# Patient Record
Sex: Female | Born: 1950
Health system: Southern US, Community
[De-identification: ages and names within clinical notes are randomized; demographics above are authoritative.]

## PROBLEM LIST (undated history)

## (undated) DIAGNOSIS — Z9889 Other specified postprocedural states: Secondary | ICD-10-CM

## (undated) DIAGNOSIS — J189 Pneumonia, unspecified organism: Secondary | ICD-10-CM

## (undated) DIAGNOSIS — R112 Nausea with vomiting, unspecified: Secondary | ICD-10-CM

## (undated) DIAGNOSIS — H269 Unspecified cataract: Secondary | ICD-10-CM

## (undated) DIAGNOSIS — J45909 Unspecified asthma, uncomplicated: Secondary | ICD-10-CM

## (undated) HISTORY — PX: BACK SURGERY: SHX140

## (undated) HISTORY — PX: TOTAL HIP ARTHROPLASTY: SHX124

## (undated) HISTORY — PX: APPENDECTOMY: SHX54

## (undated) HISTORY — PX: BREAST LUMPECTOMY: SHX2

## (undated) HISTORY — PX: TONSILLECTOMY: SUR1361

## (undated) HISTORY — DX: Unspecified cataract: H26.9

---

## 2019-07-20 ENCOUNTER — Other Ambulatory Visit: Payer: Self-pay | Admitting: Neurosurgery

## 2019-07-20 DIAGNOSIS — G959 Disease of spinal cord, unspecified: Secondary | ICD-10-CM

## 2019-07-20 DIAGNOSIS — M5416 Radiculopathy, lumbar region: Secondary | ICD-10-CM

## 2019-08-20 ENCOUNTER — Ambulatory Visit
Admission: RE | Admit: 2019-08-20 | Discharge: 2019-08-20 | Disposition: A | Discharge status: Home / Self Care | Payer: Medicare Other | Source: Ambulatory Visit | Attending: Neurosurgery | Admitting: Neurosurgery

## 2019-08-20 ENCOUNTER — Other Ambulatory Visit: Payer: Self-pay

## 2019-08-20 DIAGNOSIS — G959 Disease of spinal cord, unspecified: Secondary | ICD-10-CM

## 2019-08-20 DIAGNOSIS — M5416 Radiculopathy, lumbar region: Secondary | ICD-10-CM

## 2019-08-26 ENCOUNTER — Other Ambulatory Visit: Payer: Self-pay | Admitting: Neurosurgery

## 2019-09-17 NOTE — Pre-Procedure Instructions (Signed)
[id number] [place], [place] - [place] [address] [place] [address] Phone: [phone] Fax: [phone]      Your procedure is scheduled on Monday, October 12th.  Report to [facility] Main Entrance "A" at 6:00 A.M., and check in at the Admitting office.  Call this number if you have problems the morning of surgery:  [phone]  Call [phone] if you have any questions prior to your surgery date Monday-Friday 8am-4pm    Remember:  Do not eat or drink after midnight the night before your surgery    Take these medicines the morning of surgery with A SIP OF WATER  ondansetron (ZOFRAN)-as needed traMADol (ULTRAM)-as needed  As of today, STOP taking any Aspirin (unless otherwise instructed by your surgeon), Aleve, Naproxen, Ibuprofen, Motrin, Advil, Goody's, BC's, all herbal medications, fish oil, and all vitamins.    The Morning of Surgery  Do not wear jewelry, make-up or nail polish.  Do not wear lotions, powders, or perfumes, or deodorant  Do not shave 48 hours prior to surgery.    Do not bring valuables to the hospital.  [facility] is not responsible for any belongings or valuables.  If you are a smoker, DO NOT Smoke 24 hours prior to surgery IF you wear a CPAP at night please bring your mask, tubing, and machine the morning of surgery   Remember that you must have someone to transport you home after your surgery, and remain with you for 24 hours if you are discharged the same day.   Contacts, glasses, hearing aids, dentures or bridgework may not be worn into surgery.    Leave your suitcase in the car.  After surgery it may be brought to your room.  For patients admitted to the hospital, discharge time will be determined by your treatment team.  Patients discharged the day of surgery will not be allowed to drive home.    Special instructions:   [facility]- Preparing For Surgery  Before surgery, you  can play an important role. Because skin is not sterile, your skin needs to be as free of germs as possible. You can reduce the number of germs on your skin by washing with CHG (chlorahexidine gluconate) Soap before surgery.  CHG is an antiseptic cleaner which kills germs and bonds with the skin to continue killing germs even after washing.    Oral Hygiene is also important to reduce your risk of infection.  Remember - BRUSH YOUR TEETH THE MORNING OF SURGERY WITH YOUR REGULAR TOOTHPASTE  Please do not use if you have an allergy to CHG or antibacterial soaps. If your skin becomes reddened/irritated stop using the CHG.  Do not shave (including legs and underarms) for at least 48 hours prior to first CHG shower. It is OK to shave your face.  Please follow these instructions carefully.   1. Shower the NIGHT BEFORE SURGERY and the MORNING OF SURGERY with CHG Soap.   2. If you chose to wash your hair, wash your hair first as usual with your normal shampoo.  3. After you shampoo, rinse your hair and body thoroughly to remove the shampoo.  4. Use CHG as you would any other liquid soap. You can apply CHG directly to the skin and wash gently with a scrungie or a clean washcloth.   5. Apply the CHG Soap to your body ONLY FROM THE NECK DOWN.  Do not use on open  wounds or open sores. Avoid contact with your eyes, ears, mouth and genitals (private parts). Wash Face and genitals (private parts)  with your normal soap.   6. Wash thoroughly, paying special attention to the area where your surgery will be performed.  7. Thoroughly rinse your body with warm water from the neck down.  8. DO NOT shower/wash with your normal soap after using and rinsing off the CHG Soap.  9. Pat yourself dry with a CLEAN TOWEL.  10. Wear CLEAN PAJAMAS to bed the night before surgery, wear comfortable clothes the morning of surgery  11. Place CLEAN SHEETS on your bed the night of your first shower and DO NOT SLEEP WITH  PETS.    Day of Surgery:  Do not apply any deodorants/lotions. Please shower the morning of surgery with the CHG soap  Please wear clean clothes to the hospital/surgery center.   Remember to brush your teeth WITH YOUR REGULAR TOOTHPASTE.   Please read over the following fact sheets that you were given.

## 2019-09-20 ENCOUNTER — Other Ambulatory Visit: Payer: Self-pay

## 2019-09-20 ENCOUNTER — Encounter (HOSPITAL_COMMUNITY)
Admission: RE | Admit: 2019-09-20 | Discharge: 2019-09-20 | Disposition: A | Discharge status: Home / Self Care | Payer: Medicare Other | Source: Ambulatory Visit | Attending: Neurosurgery | Admitting: Neurosurgery

## 2019-09-20 ENCOUNTER — Encounter (HOSPITAL_COMMUNITY): Payer: Self-pay

## 2019-09-20 DIAGNOSIS — Z01812 Encounter for preprocedural laboratory examination: Secondary | ICD-10-CM | POA: Insufficient documentation

## 2019-09-20 HISTORY — DX: Pneumonia, unspecified organism: J18.9

## 2019-09-20 HISTORY — DX: Other specified postprocedural states: Z98.890

## 2019-09-20 HISTORY — DX: Other specified postprocedural states: R11.2

## 2019-09-20 HISTORY — DX: Unspecified asthma, uncomplicated: J45.909

## 2019-09-20 LAB — CBC WITH DIFFERENTIAL/PLATELET
Abs Immature Granulocytes: 0.01 10*3/uL (ref 0.00–0.07)
Basophils Absolute: 0 10*3/uL (ref 0.0–0.1)
Basophils Relative: 0 %
Eosinophils Absolute: 0 10*3/uL (ref 0.0–0.5)
Eosinophils Relative: 1 %
HCT: 38.1 % (ref 36.0–46.0)
Hemoglobin: 13.2 g/dL (ref 12.0–15.0)
Immature Granulocytes: 0 %
Lymphocytes Relative: 32 %
Lymphs Abs: 1.5 10*3/uL (ref 0.7–4.0)
MCH: 34.4 pg — ABNORMAL HIGH (ref 26.0–34.0)
MCHC: 34.6 g/dL (ref 30.0–36.0)
MCV: 99.2 fL (ref 80.0–100.0)
Monocytes Absolute: 0.5 10*3/uL (ref 0.1–1.0)
Monocytes Relative: 10 %
Neutro Abs: 2.6 10*3/uL (ref 1.7–7.7)
Neutrophils Relative %: 57 %
Platelets: 330 10*3/uL (ref 150–400)
RBC: 3.84 MIL/uL — ABNORMAL LOW (ref 3.87–5.11)
RDW: 12.2 % (ref 11.5–15.5)
WBC: 4.6 10*3/uL (ref 4.0–10.5)
nRBC: 0 % (ref 0.0–0.2)

## 2019-09-20 LAB — BASIC METABOLIC PANEL
Anion gap: 9 (ref 5–15)
BUN: 10 mg/dL (ref 8–23)
CO2: 23 mmol/L (ref 22–32)
Calcium: 9.4 mg/dL (ref 8.9–10.3)
Chloride: 104 mmol/L (ref 98–111)
Creatinine, Ser: 0.68 mg/dL (ref 0.44–1.00)
GFR calc Af Amer: >60 mL/min (ref >60)
GFR calc non Af Amer: >60 mL/min (ref >60)
Glucose, Bld: 95 mg/dL (ref 70–99)
Potassium: 4.6 mmol/L (ref 3.5–5.1)
Sodium: 136 mmol/L (ref 135–145)

## 2019-09-20 LAB — SURGICAL PCR SCREEN
MRSA, PCR: NEGATIVE
Staphylococcus aureus: POSITIVE — AB

## 2019-09-20 NOTE — Progress Notes (Signed)
PCP - Denies Cardiologist - Denies  PPM/ICD - N/A Device Orders - N/A Rep Notified N/A  Chest x-ray - N/A EKG - N/A Stress Test - N/A ECHO - N/A Cardiac Cath - N/A   Sleep Study - Denies CPAP - N/A  Fasting Blood Sugar - N/A Checks Blood Sugar __N/A___ times a day  Blood Thinner Instructions: N/A Aspirin Instructions: N/A  ERAS Protcol - N/A PRE-SURGERY Ensure - N/A  COVID TEST- 09/23/2019   Anesthesia review: No   Patient denies shortness of breath, fever, cough and chest pain at PAT appointment   Coronavirus Screening  Have you experienced the following symptoms:  Cough yes/no: No Fever (>100.110F)  yes/no: No Runny nose yes/no: No Sore throat yes/no: No Difficulty breathing/shortness of breath  yes/no: No  Have you or a family member traveled in the last 14 days and where? yes/no: No   If the patient indicates "YES" to the above questions, their PAT will be rescheduled to limit the exposure to others and, the surgeon will be notified. THE PATIENT WILL NEED TO BE ASYMPTOMATIC FOR 14 DAYS.   If the patient is not experiencing any of these symptoms, the PAT nurse will instruct them to NOT bring anyone with them to their appointment since they may have these symptoms or traveled as well.   Please remind your patients and families that hospital visitation restrictions are in effect and the importance of the restrictions.     Patient verbalized understanding of instructions that were given to them at the PAT appointment. Patient was also instructed that they will need to review over the PAT instructions again at home before surgery.

## 2019-09-23 ENCOUNTER — Other Ambulatory Visit: Payer: Self-pay

## 2019-09-23 ENCOUNTER — Other Ambulatory Visit (HOSPITAL_COMMUNITY)
Admission: RE | Admit: 2019-09-23 | Discharge: 2019-09-23 | Disposition: A | Discharge status: Home / Self Care | Payer: Medicare Other | Source: Ambulatory Visit | Attending: Neurosurgery | Admitting: Neurosurgery

## 2019-09-23 DIAGNOSIS — Z20828 Contact with and (suspected) exposure to other viral communicable diseases: Secondary | ICD-10-CM | POA: Diagnosis not present

## 2019-09-23 DIAGNOSIS — Z01812 Encounter for preprocedural laboratory examination: Secondary | ICD-10-CM | POA: Insufficient documentation

## 2019-09-23 LAB — SARS CORONAVIRUS 2 (TAT 6-24 HRS): SARS Coronavirus 2: NEGATIVE

## 2019-09-26 NOTE — Anesthesia Preprocedure Evaluation (Addendum)
Anesthesia Evaluation  Patient identified by MRN, date of birth, ID band Patient awake    Reviewed: Allergy & Precautions, H&P , NPO status , Patient's Chart, lab work & pertinent test results  History of Anesthesia Complications (+) PONV and history of anesthetic complications  Airway Mallampati: I  TM Distance: >3 FB Neck ROM: Full    Dental no notable dental hx. (+) Teeth Intact, Dental Advisory Given   Pulmonary neg pulmonary ROS, asthma ,    Pulmonary exam normal breath sounds clear to auscultation       Cardiovascular Exercise Tolerance: Good negative cardio ROS Normal cardiovascular exam Rhythm:Regular Rate:Normal     Neuro/Psych negative neurological ROS  negative psych ROS   GI/Hepatic negative GI ROS, Neg liver ROS,   Endo/Other  negative endocrine ROS  Renal/GU negative Renal ROS  negative genitourinary   Musculoskeletal negative musculoskeletal ROS (+)   Abdominal   Peds negative pediatric ROS (+)  Hematology negative hematology ROS (+)   Anesthesia Other Findings   Reproductive/Obstetrics negative OB ROS                            Anesthesia Physical Anesthesia Plan  ASA: II  Anesthesia Plan: General   Post-op Pain Management:    Induction: Intravenous  PONV Risk Score and Plan: 4 or greater and Ondansetron, Dexamethasone, Treatment may vary due to age or medical condition and Propofol infusion  Airway Management Planned: Oral ETT  Additional Equipment:   Intra-op Plan:   Post-operative Plan: Extubation in OR  Informed Consent: I have reviewed the patients History and Physical, chart, labs and discussed the procedure including the risks, benefits and alternatives for the proposed anesthesia with the patient or authorized representative who has indicated his/her understanding and acceptance.       Plan Discussed with: Anesthesiologist and  CRNA  Anesthesia Plan Comments: (  )       Anesthesia Quick Evaluation

## 2019-09-27 ENCOUNTER — Inpatient Hospital Stay (HOSPITAL_COMMUNITY): Payer: Medicare Other

## 2019-09-27 ENCOUNTER — Inpatient Hospital Stay (HOSPITAL_COMMUNITY): Payer: Medicare Other | Admitting: Anesthesiology

## 2019-09-27 ENCOUNTER — Encounter (HOSPITAL_COMMUNITY): Payer: Self-pay | Admitting: *Deleted

## 2019-09-27 ENCOUNTER — Inpatient Hospital Stay (HOSPITAL_COMMUNITY)
Admission: RE | Admit: 2019-09-27 | Discharge: 2019-09-28 | DRG: 472 | Disposition: A | Discharge status: Home / Self Care | Payer: Medicare Other | Attending: Neurosurgery | Admitting: Neurosurgery

## 2019-09-27 ENCOUNTER — Encounter (HOSPITAL_COMMUNITY)
Admission: RE | Disposition: A | Discharge status: Home / Self Care | Payer: Self-pay | Source: Home / Self Care | Attending: Neurosurgery

## 2019-09-27 ENCOUNTER — Other Ambulatory Visit: Payer: Self-pay

## 2019-09-27 DIAGNOSIS — G992 Myelopathy in diseases classified elsewhere: Secondary | ICD-10-CM | POA: Diagnosis present

## 2019-09-27 DIAGNOSIS — M4802 Spinal stenosis, cervical region: Secondary | ICD-10-CM | POA: Diagnosis present

## 2019-09-27 DIAGNOSIS — Z419 Encounter for procedure for purposes other than remedying health state, unspecified: Secondary | ICD-10-CM

## 2019-09-27 DIAGNOSIS — M542 Cervicalgia: Secondary | ICD-10-CM | POA: Diagnosis present

## 2019-09-27 DIAGNOSIS — Z882 Allergy status to sulfonamides status: Secondary | ICD-10-CM | POA: Diagnosis not present

## 2019-09-27 DIAGNOSIS — G959 Disease of spinal cord, unspecified: Secondary | ICD-10-CM | POA: Diagnosis present

## 2019-09-27 HISTORY — PX: ANTERIOR CERVICAL DECOMP/DISCECTOMY FUSION: SHX1161

## 2019-09-27 SURGERY — ANTERIOR CERVICAL DECOMPRESSION/DISCECTOMY FUSION 2 LEVELS
Anesthesia: General | Site: Spine Cervical

## 2019-09-27 MED ORDER — THROMBIN 5000 UNITS EX SOLR
OROMUCOSAL | Status: DC | PRN
  Administered 2019-09-27: 5 mL via TOPICAL

## 2019-09-27 MED ORDER — CYCLOBENZAPRINE HCL 10 MG PO TABS
ORAL_TABLET | ORAL | Status: AC
  Administered 2019-09-27: 11:00:00 10 mg via ORAL
  Filled 2019-09-27: qty 1

## 2019-09-27 MED ORDER — LIDOCAINE 2% (20 MG/ML) 5 ML SYRINGE
INTRAMUSCULAR | Status: AC
  Filled 2019-09-27: qty 5

## 2019-09-27 MED ORDER — CHLORHEXIDINE GLUCONATE CLOTH 2 % EX PADS: 6.0000 | MEDICATED_PAD | Freq: Once | CUTANEOUS | Status: DC

## 2019-09-27 MED ORDER — LACTATED RINGERS IV SOLN
INTRAVENOUS | Status: DC
  Administered 2019-09-27: 07:00:00 via INTRAVENOUS

## 2019-09-27 MED ORDER — MENTHOL 3 MG MT LOZG: 1.0000 | LOZENGE | OROMUCOSAL | Status: DC | PRN

## 2019-09-27 MED ORDER — ACETAMINOPHEN 325 MG PO TABS: 650.0000 mg | ORAL_TABLET | ORAL | Status: DC | PRN

## 2019-09-27 MED ORDER — SODIUM CHLORIDE 0.9 % IV SOLN
INTRAVENOUS | Status: DC | PRN
  Administered 2019-09-27: 500 mL

## 2019-09-27 MED ORDER — SODIUM CHLORIDE 0.9% FLUSH
3.0000 mL | Freq: Two times a day (BID) | INTRAVENOUS | Status: DC
  Administered 2019-09-27 (×2): 3 mL via INTRAVENOUS

## 2019-09-27 MED ORDER — 0.9 % SODIUM CHLORIDE (POUR BTL) OPTIME
TOPICAL | Status: DC | PRN
  Administered 2019-09-27: 1000 mL

## 2019-09-27 MED ORDER — SUGAMMADEX SODIUM 200 MG/2ML IV SOLN
INTRAVENOUS | Status: DC | PRN
  Administered 2019-09-27: 200 mg via INTRAVENOUS

## 2019-09-27 MED ORDER — ONDANSETRON HCL 4 MG/2ML IJ SOLN
INTRAMUSCULAR | Status: AC
  Filled 2019-09-27: qty 2

## 2019-09-27 MED ORDER — CEFAZOLIN SODIUM-DEXTROSE 2-4 GM/100ML-% IV SOLN
2.0000 g | INTRAVENOUS | Status: AC
  Administered 2019-09-27: 2 g via INTRAVENOUS
  Filled 2019-09-27: qty 100

## 2019-09-27 MED ORDER — FENTANYL CITRATE (PF) 250 MCG/5ML IJ SOLN
INTRAMUSCULAR | Status: AC
  Filled 2019-09-27: qty 5

## 2019-09-27 MED ORDER — TRAMADOL HCL 50 MG PO TABS: 50.0000 mg | ORAL_TABLET | Freq: Four times a day (QID) | ORAL | Status: DC | PRN

## 2019-09-27 MED ORDER — FENTANYL CITRATE (PF) 100 MCG/2ML IJ SOLN
25.0000 ug | INTRAMUSCULAR | Status: DC | PRN
  Administered 2019-09-27: 11:00:00 25 ug via INTRAVENOUS
  Administered 2019-09-27: 11:00:00 50 ug via INTRAVENOUS
  Administered 2019-09-27: 25 ug via INTRAVENOUS

## 2019-09-27 MED ORDER — FENTANYL CITRATE (PF) 100 MCG/2ML IJ SOLN
INTRAMUSCULAR | Status: DC | PRN
  Administered 2019-09-27 (×5): 50 ug via INTRAVENOUS

## 2019-09-27 MED ORDER — THROMBIN 5000 UNITS EX SOLR
CUTANEOUS | Status: DC | PRN
  Administered 2019-09-27 (×2): 5000 [IU] via TOPICAL

## 2019-09-27 MED ORDER — PROPOFOL 10 MG/ML IV BOLUS
INTRAVENOUS | Status: AC
  Filled 2019-09-27: qty 20

## 2019-09-27 MED ORDER — HYDROCODONE-ACETAMINOPHEN 5-325 MG PO TABS: 1.0000 | ORAL_TABLET | ORAL | Status: DC | PRN

## 2019-09-27 MED ORDER — SODIUM CHLORIDE 0.9% FLUSH: 3.0000 mL | INTRAVENOUS | Status: DC | PRN

## 2019-09-27 MED ORDER — ONDANSETRON HCL 4 MG PO TABS: 4.0000 mg | ORAL_TABLET | Freq: Three times a day (TID) | ORAL | Status: DC | PRN

## 2019-09-27 MED ORDER — MIDAZOLAM HCL 5 MG/5ML IJ SOLN
INTRAMUSCULAR | Status: DC | PRN
  Administered 2019-09-27: 1 mg via INTRAVENOUS

## 2019-09-27 MED ORDER — ONDANSETRON HCL 4 MG/2ML IJ SOLN
INTRAMUSCULAR | Status: DC | PRN
  Administered 2019-09-27: 4 mg via INTRAVENOUS

## 2019-09-27 MED ORDER — OXYCODONE HCL 5 MG PO TABS
ORAL_TABLET | ORAL | Status: AC
  Administered 2019-09-27: 5 mg via ORAL
  Filled 2019-09-27: qty 1

## 2019-09-27 MED ORDER — HYDROCODONE-ACETAMINOPHEN 10-325 MG PO TABS
2.0000 | ORAL_TABLET | ORAL | Status: DC | PRN
  Administered 2019-09-27 – 2019-09-28 (×5): 2 via ORAL
  Filled 2019-09-27 (×5): qty 2

## 2019-09-27 MED ORDER — OXYCODONE HCL 5 MG/5ML PO SOLN: 5.0000 mg | Freq: Once | ORAL | Status: AC | PRN

## 2019-09-27 MED ORDER — ROCURONIUM BROMIDE 10 MG/ML (PF) SYRINGE
PREFILLED_SYRINGE | INTRAVENOUS | Status: DC | PRN
  Administered 2019-09-27: 20 mg via INTRAVENOUS
  Administered 2019-09-27: 60 mg via INTRAVENOUS

## 2019-09-27 MED ORDER — ONDANSETRON HCL 4 MG/2ML IJ SOLN
INTRAMUSCULAR | Status: AC
  Administered 2019-09-27: 4 mg via INTRAVENOUS
  Filled 2019-09-27: qty 2

## 2019-09-27 MED ORDER — DEXAMETHASONE SODIUM PHOSPHATE 10 MG/ML IJ SOLN
INTRAMUSCULAR | Status: AC
  Filled 2019-09-27: qty 1

## 2019-09-27 MED ORDER — DEXAMETHASONE SODIUM PHOSPHATE 10 MG/ML IJ SOLN
10.0000 mg | Freq: Once | INTRAMUSCULAR | Status: AC
  Administered 2019-09-27: 10 mg via INTRAVENOUS
  Filled 2019-09-27: qty 1

## 2019-09-27 MED ORDER — CYCLOBENZAPRINE HCL 10 MG PO TABS
10.0000 mg | ORAL_TABLET | Freq: Three times a day (TID) | ORAL | Status: DC | PRN
  Administered 2019-09-27 – 2019-09-28 (×2): 10 mg via ORAL
  Filled 2019-09-27 (×2): qty 1

## 2019-09-27 MED ORDER — MEPERIDINE HCL 25 MG/ML IJ SOLN: 6.2500 mg | INTRAMUSCULAR | Status: DC | PRN

## 2019-09-27 MED ORDER — ACETAMINOPHEN 160 MG/5ML PO SOLN: 325.0000 mg | ORAL | Status: DC | PRN

## 2019-09-27 MED ORDER — ONDANSETRON HCL 4 MG PO TABS
4.0000 mg | ORAL_TABLET | Freq: Four times a day (QID) | ORAL | Status: DC | PRN
  Administered 2019-09-27: 15:00:00 4 mg via ORAL
  Filled 2019-09-27: qty 1

## 2019-09-27 MED ORDER — PROPOFOL 10 MG/ML IV BOLUS
INTRAVENOUS | Status: DC | PRN
  Administered 2019-09-27: 100 mg via INTRAVENOUS

## 2019-09-27 MED ORDER — ADULT MULTIVITAMIN W/MINERALS CH: 1.0000 | ORAL_TABLET | Freq: Every day | ORAL | Status: DC

## 2019-09-27 MED ORDER — HEMOSTATIC AGENTS (NO CHARGE) OPTIME
TOPICAL | Status: DC | PRN
  Administered 2019-09-27: 1 via TOPICAL

## 2019-09-27 MED ORDER — PHENOL 1.4 % MT LIQD: 1.0000 | OROMUCOSAL | Status: DC | PRN

## 2019-09-27 MED ORDER — HYDROMORPHONE HCL 1 MG/ML IJ SOLN
1.0000 mg | INTRAMUSCULAR | Status: DC | PRN
  Administered 2019-09-27: 1 mg via INTRAVENOUS
  Filled 2019-09-27: qty 1

## 2019-09-27 MED ORDER — ONDANSETRON HCL 4 MG/2ML IJ SOLN
4.0000 mg | Freq: Once | INTRAMUSCULAR | Status: AC | PRN
  Administered 2019-09-27: 11:00:00 4 mg via INTRAVENOUS

## 2019-09-27 MED ORDER — MIDAZOLAM HCL 2 MG/2ML IJ SOLN
INTRAMUSCULAR | Status: AC
  Filled 2019-09-27: qty 2

## 2019-09-27 MED ORDER — CEFAZOLIN SODIUM-DEXTROSE 1-4 GM/50ML-% IV SOLN
1.0000 g | Freq: Three times a day (TID) | INTRAVENOUS | Status: AC
  Administered 2019-09-27 (×2): 1 g via INTRAVENOUS
  Filled 2019-09-27 (×2): qty 50

## 2019-09-27 MED ORDER — ACETAMINOPHEN 325 MG PO TABS: 325.0000 mg | ORAL_TABLET | ORAL | Status: DC | PRN

## 2019-09-27 MED ORDER — OXYCODONE HCL 5 MG PO TABS
5.0000 mg | ORAL_TABLET | Freq: Once | ORAL | Status: AC | PRN
  Administered 2019-09-27: 11:00:00 5 mg via ORAL

## 2019-09-27 MED ORDER — THROMBIN 5000 UNITS EX SOLR
CUTANEOUS | Status: AC
  Filled 2019-09-27: qty 15000

## 2019-09-27 MED ORDER — ONDANSETRON HCL 4 MG/2ML IJ SOLN: 4.0000 mg | Freq: Four times a day (QID) | INTRAMUSCULAR | Status: DC | PRN

## 2019-09-27 MED ORDER — ROCURONIUM BROMIDE 10 MG/ML (PF) SYRINGE
PREFILLED_SYRINGE | INTRAVENOUS | Status: AC
  Filled 2019-09-27: qty 10

## 2019-09-27 MED ORDER — LIDOCAINE 2% (20 MG/ML) 5 ML SYRINGE
INTRAMUSCULAR | Status: DC | PRN
  Administered 2019-09-27: 100 mg via INTRAVENOUS

## 2019-09-27 MED ORDER — HYDROXYZINE HCL 50 MG/ML IM SOLN: 50.0000 mg | Freq: Four times a day (QID) | INTRAMUSCULAR | Status: DC | PRN

## 2019-09-27 MED ORDER — FENTANYL CITRATE (PF) 100 MCG/2ML IJ SOLN
INTRAMUSCULAR | Status: AC
  Administered 2019-09-27: 25 ug via INTRAVENOUS
  Filled 2019-09-27: qty 2

## 2019-09-27 MED ORDER — SODIUM CHLORIDE 0.9 % IV SOLN
250.0000 mL | INTRAVENOUS | Status: DC
  Administered 2019-09-27: 12:00:00 250 mL via INTRAVENOUS

## 2019-09-27 MED ORDER — ACETAMINOPHEN 650 MG RE SUPP: 650.0000 mg | RECTAL | Status: DC | PRN

## 2019-09-27 SURGICAL SUPPLY — 58 items
BAG DECANTER FOR FLEXI CONT (MISCELLANEOUS) ×2 IMPLANT
BENZOIN TINCTURE PRP APPL 2/3 (GAUZE/BANDAGES/DRESSINGS) ×2 IMPLANT
BIT DRILL 13 (BIT) ×2 IMPLANT
BUR MATCHSTICK NEURO 3.0 LAGG (BURR) ×2 IMPLANT
CAGE PEEK 6X14X11 (Cage) ×2 IMPLANT
CANISTER SUCT 3000ML PPV (MISCELLANEOUS) ×2 IMPLANT
CARTRIDGE OIL MAESTRO DRILL (MISCELLANEOUS) ×1 IMPLANT
CLOSURE STERI-STRIP 1/4X4 (GAUZE/BANDAGES/DRESSINGS) ×2 IMPLANT
COVER WAND RF STERILE (DRAPES) ×2 IMPLANT
DIFFUSER DRILL AIR PNEUMATIC (MISCELLANEOUS) ×2 IMPLANT
DRAPE C-ARM 42X72 X-RAY (DRAPES) ×4 IMPLANT
DRAPE INCISE IOBAN 66X45 STRL (DRAPES) ×2 IMPLANT
DRAPE LAPAROTOMY 100X72 PEDS (DRAPES) ×2 IMPLANT
DRAPE MICROSCOPE LEICA (MISCELLANEOUS) ×2 IMPLANT
DURAPREP 6ML APPLICATOR 50/CS (WOUND CARE) ×4 IMPLANT
ELECT COATED BLADE 2.86 ST (ELECTRODE) ×2 IMPLANT
ELECT REM PT RETURN 9FT ADLT (ELECTROSURGICAL) ×2
ELECTRODE REM PT RTRN 9FT ADLT (ELECTROSURGICAL) ×1 IMPLANT
GAUZE 4X4 16PLY RFD (DISPOSABLE) IMPLANT
GAUZE SPONGE 4X4 12PLY STRL (GAUZE/BANDAGES/DRESSINGS) ×2 IMPLANT
GLOVE ECLIPSE 9.0 STRL (GLOVE) ×2 IMPLANT
GLOVE EXAM NITRILE XL STR (GLOVE) IMPLANT
GLOVE INDICATOR 7.5 STRL GRN (GLOVE) ×4 IMPLANT
GLOVE SURG SS PI 7.5 STRL IVOR (GLOVE) ×8 IMPLANT
GOWN STRL REUS W/ TWL LRG LVL3 (GOWN DISPOSABLE) IMPLANT
GOWN STRL REUS W/ TWL XL LVL3 (GOWN DISPOSABLE) ×2 IMPLANT
GOWN STRL REUS W/TWL 2XL LVL3 (GOWN DISPOSABLE) IMPLANT
GOWN STRL REUS W/TWL LRG LVL3 (GOWN DISPOSABLE)
GOWN STRL REUS W/TWL XL LVL3 (GOWN DISPOSABLE) ×2
HALTER HD/CHIN CERV TRACTION D (MISCELLANEOUS) ×2 IMPLANT
HEMOSTAT POWDER KIT SURGIFOAM (HEMOSTASIS) IMPLANT
HEMOSTAT POWDER SURGIFOAM 1G (HEMOSTASIS) ×2 IMPLANT
HEMOSTAT SURGICEL 2X14 (HEMOSTASIS) IMPLANT
KIT BASIN OR (CUSTOM PROCEDURE TRAY) ×2 IMPLANT
KIT TURNOVER KIT B (KITS) ×2 IMPLANT
NEEDLE SPNL 20GX3.5 QUINCKE YW (NEEDLE) ×2 IMPLANT
NS IRRIG 1000ML POUR BTL (IV SOLUTION) ×2 IMPLANT
OIL CARTRIDGE MAESTRO DRILL (MISCELLANEOUS) ×2
PACK LAMINECTOMY NEURO (CUSTOM PROCEDURE TRAY) ×2 IMPLANT
PAD ARMBOARD 7.5X6 YLW CONV (MISCELLANEOUS) ×6 IMPLANT
PLATE VISION ELITE 40MM (Plate) ×2 IMPLANT
RUBBERBAND STERILE (MISCELLANEOUS) ×4 IMPLANT
SCREW ST 13X4XST VA NS SPNE (Screw) ×6 IMPLANT
SCREW ST VAR 4 ATL (Screw) ×6 IMPLANT
SPACER SPNL 11X14X6XPEEK CVD (Cage) ×2 IMPLANT
SPCR SPNL 11X14X6XPEEK CVD (Cage) ×2 IMPLANT
SPONGE INTESTINAL PEANUT (DISPOSABLE) ×2 IMPLANT
SPONGE SURGIFOAM ABS GEL SZ50 (HEMOSTASIS) ×2 IMPLANT
STRIP CLOSURE SKIN 1/2X4 (GAUZE/BANDAGES/DRESSINGS) ×2 IMPLANT
SUT VIC AB 3-0 SH 8-18 (SUTURE) ×2 IMPLANT
SUT VIC AB 4-0 RB1 18 (SUTURE) ×4 IMPLANT
TAPE CLOTH 4X10 WHT NS (GAUZE/BANDAGES/DRESSINGS) IMPLANT
TAPE CLOTH SURG 4X10 WHT LF (GAUZE/BANDAGES/DRESSINGS) ×2 IMPLANT
TAPE PAPER MEDFIX 1IN X 10YD (GAUZE/BANDAGES/DRESSINGS) ×2 IMPLANT
TOWEL GREEN STERILE (TOWEL DISPOSABLE) ×2 IMPLANT
TOWEL GREEN STERILE FF (TOWEL DISPOSABLE) ×2 IMPLANT
TRAP SPECIMEN MUCOUS 40CC (MISCELLANEOUS) ×2 IMPLANT
WATER STERILE IRR 1000ML POUR (IV SOLUTION) ×2 IMPLANT

## 2019-09-27 NOTE — Brief Op Note (Signed)
09/27/2019  10:08 AM  PATIENT:  Baruch Merl  68 y.o. female  PRE-OPERATIVE DIAGNOSIS:  myelopathy  POST-OPERATIVE DIAGNOSIS:  myelopathy  PROCEDURE:  Procedure(s): Anterior Cervical Decompression/disectomy Fusion - Cervical Three-Cervical Four - Cervical Four-Cervical Five (N/A)  SURGEON:  Surgeon(s) and Role:    * Earnie Larsson, MD - Primary  PHYSICIAN ASSISTANT:   ASSISTANTS: None   ANESTHESIA:   general  EBL:  225 mL   BLOOD ADMINISTERED:none  DRAINS: none   LOCAL MEDICATIONS USED:  NONE  SPECIMEN:  No Specimen  DISPOSITION OF SPECIMEN:  N/A  COUNTS:  YES  TOURNIQUET:  * No tourniquets in log *  DICTATION: .Dragon Dictation  PLAN OF CARE: Admit to inpatient   PATIENT DISPOSITION:  PACU - hemodynamically stable.   Delay start of Pharmacological VTE agent (>24hrs) due to surgical blood loss or risk of bleeding: yes

## 2019-09-27 NOTE — Transfer of Care (Signed)
Immediate Anesthesia Transfer of Care Note  Patient: Terri Hill  Procedure(s) Performed: Anterior Cervical Decompression/disectomy Fusion - Cervical Three-Cervical Four - Cervical Four-Cervical Five (N/A Spine Cervical)  Patient Location: PACU  Anesthesia Type:General  Level of Consciousness: awake, alert  and oriented  Airway & Oxygen Therapy: Patient Spontanous Breathing and Patient connected to nasal cannula oxygen  Post-op Assessment: Report given to RN, Post -op Vital signs reviewed and stable and Patient moving all extremities X 4  Post vital signs: Reviewed and stable  Last Vitals:  Vitals Value Taken Time  BP 137/80 09/27/19 1023  Temp    Pulse 82 09/27/19 1028  Resp 28 09/27/19 1028  SpO2 100 % 09/27/19 1028  Vitals shown include unvalidated device data.  Last Pain:  Vitals:   09/27/19 0723  PainSc: 0-No pain      Patients Stated Pain Goal: 4 ([id number])  Complications: No apparent anesthesia complications

## 2019-09-27 NOTE — H&P (Signed)
  Terri Hill is an 68 y.o. female.   Chief Complaint: Neck pain and weakness HPI: 68 year old female status post prior C5-6 and C6-7 anterior cervical discectomy and fusion presents with progressive neck pain with bilateral upper extremity numbness paresthesias and weakness right greater than left.  Work-up demonstrates evidence of prior anterior cervical fusion at C5-6 and C6-7.  Patient with marked adjacent level disc degeneration at C3-4 and C4-5 with significant retrolisthesis of C4 on C5 with marked spinal stenosis and cord compression.  Patient also with severe disc degeneration and broad-based disc protrusion with severe stenosis at C3-4.  Patient presents now for two-level anterior cervical decompression and fusion in hopes of improving her symptoms.  Past Medical History:  Diagnosis Date  . Asthma    2019  . Pneumonia   . PONV (postoperative nausea and vomiting)     Past Surgical History:  Procedure Laterality Date  . APPENDECTOMY     48 years ago  . BACK SURGERY     ~18 years ago  . BREAST LUMPECTOMY Bilateral    more than 10 years ago  . TONSILLECTOMY     68 years old    History reviewed. No pertinent family history. Social History:  reports that she has never smoked. She has never used smokeless tobacco. She reports that she does not drink alcohol or use drugs.  Allergies:  Allergies  Allergen Reactions  . Sulfa Antibiotics Nausea And Vomiting    Medications Prior to Admission  Medication Sig Dispense Refill  . Multiple Vitamin (MULTIVITAMIN WITH MINERALS) TABS tablet Take 1 tablet by mouth daily.    . naproxen sodium (ALEVE) 220 MG tablet Take 220 mg by mouth daily as needed (pain).    . ondansetron (ZOFRAN) 4 MG tablet Take 4 mg by mouth every 8 (eight) hours as needed for nausea/vomiting.    . traMADol (ULTRAM) 50 MG tablet Take 50 mg by mouth every 6 (six) hours as needed for moderate pain.       No results found for this or any previous visit (from the  past 48 hour(s)). No results found.  Pertinent items noted in HPI and remainder of comprehensive ROS otherwise negative.  Blood pressure 119/75, pulse 66, temperature 97.6 F (36.4 C), resp. rate 16, height 5\' 2"  (1.575 m), weight 38.6 kg, SpO2 100 %.  Patient is awake and alert.  She is oriented and appropriate.  Cranial nerve function is intact.  Motor examination of the upper extremities reveals some mild weakness of grip and intrinsic strength bilaterally.  Sensory examination reveals patchy distal sensory loss in both distal upper extremities.  Gait is somewhat spastic.  Balance is poor.  Reflexes are increased.  Examination head ears eyes nose throat is unremarkable her chest and abdomen benign.  Extremities are free from injury or deformity. Assessment/Plan C3-4, C4-5 stenosis with myelopathy.  Plan C3-4, C4-5 anterior cervical discectomy with interbody fusion utilizing interbody cages, locally harvested autograft, and anterior plate instrumentation.  Risks and benefits of been explained.  Patient wishes to proceed.  Mallie Mussel A Alfred Harrel 09/27/2019, 7:55 AM

## 2019-09-27 NOTE — Op Note (Signed)
Date of procedure: 09/27/2019  Date of dictation: Same  Service: Neurosurgery  Preoperative diagnosis: Cervical stenosis with myelopathy  Postoperative diagnosis: Same  Procedure Name: C3-4, C4-5 anterior cervical discectomy with interbody fusion utilizing interbody cage, local harvested autograft, and anterior plate instrumentation  Removal of C5, C6, C7 anterior instrumentation  Surgeon:Sahid Borba A.Darene Nappi, M.D.  Asst. Surgeon: None  Anesthesia: General  Indication: 68 year old female status post prior C5-6 and C6-7 anterior cervical discectomy and fusion remotely in the past.  Patient presents now with neck pain with progressive motor and sensory loss in her upper extremities with decreased fine motor control and gait stability.  Work-up demonstrates evidence of marked adjacent level degeneration at C3-4 and C4-5 with severe stenosis and cord compression.  Patient presents now for decompression and fusion in hopes of improving her symptoms.  Operative note: After induction anesthesia, patient position supine with neck slightly extended and held in place with halter traction.  Patient's anterior cervical region prepped and draped sterilely.  Incision made overlying C5-6.  Dissection performed on the right.  Retractor placed.  Fluoroscopy used.  Levels confirmed.  Previously placed anterior cervical plate from [id number] was dissected free.  This was disassembled and removed.  Fusion at C5-6 and C6-7 appeared quite solid.  Disc spaces at C4-5 and C3-4 were incised.  Anterior osteophytes were removed.  Discectomies were then performed using various instruments down to the level of the posterior annulus.  The microscope was then brought into the field and used throughout the remainder of the discectomy.  Remaining aspects of annulus and osteophytes removed using high-speed drill down to level the posterior longitudinal ligament.  Posterior logical is not elevated and resected in piecemeal fashion.   Underlying thecal sac was then identified.  A wide central decompression was then performed by undercutting the bodies of C4 and C5.  Wide anterior foraminotomies were performed on the course exiting C5 nerve root bilaterally.  At this point a very thorough decompression had been achieved.  There was no evidence of injury to the thecal sac or nerve roots.  Procedure was then repeated at C3-4 again without complications again with good decompression.  Wound was then irrigated with saline solution.  Gelfoam was placed topically for hemostasis then removed.  [employer]tronic anatomic peek cages were packed with locally harvested autograft.  Each cage was then impacted into place and recessed slightly from the anterior cortical margin.  An Atlantis anterior cervical plate was then placed over the C3, C4, C5 levels.  This then attached under fluoroscopic guidance using 49mm variable angle screws to each at all 3 levels.  All screws given final tightening.  Locking screws were engaged.  Final images reveal good position of the cages and the hardware at the proper operative level with normal alignment of the spine.  Wound was then inspected for hemostasis which was found to be good.  Wounds and closed in layers with Vicryl sutures.  Steri-Strips and sterile dressing were applied.  There were no apparent complications.  The patient tolerated the procedure well and she returned to the recovery room postop.

## 2019-09-27 NOTE — Progress Notes (Signed)
Occupational Therapy Evaluation Patient Details Name: Terri Hill MRN: [id number] DOB: Jul 20, 1951 Today's Date: 09/27/2019    History of Present Illness pt is 57 female s/p C3-5 anterior cervical discectomy with interbody fusion and removal of C5-7. PMH including prior back surgery and breast lumpectomy.    Clinical Impression   PAT, pt lived with son in one story home and was independent for ADLs and IADLs. Pt currently presents with decreased balance, strength, activity tolerance, and increased pain. Initiated education regarding compensatory strategies for completing ADLs. Pt performed LB dressing and functional mobility with min guard A-min A for safety and balance. Pt would benefit from OT services acutely to facilitate safe dc. Recommend home with no OT follow up once medically stable per MD.     Follow Up Recommendations  No OT follow up    Equipment Recommendations  3 in 1 bedside commode    Recommendations for Other Services PT consult     Precautions / Restrictions Precautions Precautions: Cervical Precaution Booklet Issued: Yes (comment) Precaution Comments: reviewed all precautions Required Braces or Orthoses: Cervical Brace Cervical Brace: Soft collar;At all times Restrictions Weight Bearing Restrictions: No      Mobility Bed Mobility Overal bed mobility: Needs Assistance Bed Mobility: Rolling;Sidelying to Sit;Sit to Sidelying Rolling: Supervision Sidelying to sit: Supervision     Sit to sidelying: Supervision General bed mobility comments: supervision for safety  Transfers Overall transfer level: Needs assistance Equipment used: 1 person hand held assist Transfers: Sit to/from Stand Sit to Stand: Min assist         General transfer comment: min A to power up    Balance Overall balance assessment: Needs assistance Sitting-balance support: No upper extremity supported;Feet unsupported Sitting balance-Leahy Scale: Fair     Standing balance  support: No upper extremity supported;During functional activity;Single extremity supported Standing balance-Leahy Scale: Poor Standing balance comment: pt requires external support to maintain static standing                           ADL either performed or assessed with clinical judgement   ADL Overall ADL's : Needs assistance/impaired Eating/Feeding: Independent;Sitting   Grooming: Standing;Minimal assistance   Upper Body Bathing: Min guard;Sitting   Lower Body Bathing: Minimal assistance;Sit to/from stand   Upper Body Dressing : Min guard;Sitting Upper Body Dressing Details (indicate cue type and reason): initiated education regarding compensatory techniques Lower Body Dressing: Minimal assistance;Min guard;Sit to/from stand Lower Body Dressing Details (indicate cue type and reason): provided education regarding figure 4 method. required min guard A, min A to power up Toilet Transfer: Minimal assistance;Ambulation   Toileting- Clothing Manipulation and Hygiene: Minimal assistance;Sit to/from stand       Functional mobility during ADLs: Minimal assistance General ADL Comments: Pt required min A for most ADLs for safety and balance. Initiated education regarding compensatory techiques     Vision Baseline Vision/History: Wears glasses Wears Glasses: At all times Patient Visual Report: No change from baseline;Other (comment)(Pt reports dry eyes, requested eye drops)       Perception     Praxis      Pertinent Vitals/Pain Pain Assessment: 0-10 Pain Score: 10-Worst pain ever Pain Location: neck- surgical site Pain Descriptors / Indicators: Aching;Discomfort;Grimacing;Guarding;Headache;Operative site guarding;Sore Pain Intervention(s): Monitored during session;Premedicated before session;Limited activity within patient's tolerance;Repositioned     Hand Dominance Right   Extremity/Trunk Assessment Upper Extremity Assessment Upper Extremity Assessment:  Overall WFL for tasks assessed   Lower Extremity Assessment Lower  Extremity Assessment: Defer to PT evaluation   Cervical / Trunk Assessment Cervical / Trunk Assessment: Other exceptions Cervical / Trunk Exceptions: s/p neck surgery   Communication Communication Communication: No difficulties   Cognition Arousal/Alertness: Awake/alert Behavior During Therapy: WFL for tasks assessed/performed Overall Cognitive Status: Within Functional Limits for tasks assessed                                     General Comments       Exercises     Shoulder Instructions      Home Living Family/patient expects to be discharged to:: Private residence Living Arrangements: Children Available Help at Discharge: Family Type of Home: House Home Access: Stairs to enter Technical brewer of Steps: 3 Entrance Stairs-Rails: Left Home Layout: One level     Bathroom Shower/Tub: Walk-in shower;Tub/shower unit(Reports she will use walk in shower)   Bathroom Toilet: Handicapped height     Home Equipment: Clinical cytogeneticist - 2 wheels          Prior Functioning/Environment Level of Independence: Independent        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 Problem List: Decreased strength;Decreased range of motion;Decreased activity tolerance;Impaired balance (sitting and/or standing);Decreased safety awareness;Decreased knowledge of use of DME or AE;Decreased knowledge of precautions;Pain      OT Treatment/Interventions: Self-care/ADL training;Therapeutic activities;Balance training;Patient/family education    OT Goals(Current goals can be found in the care plan section) Acute Rehab OT Goals Patient Stated Goal: get better OT Goal Formulation: With patient Time For Goal Achievement: 10/11/19 Potential to Achieve Goals: Good  OT Frequency: Min 2X/week   Barriers to D/C:            Co-evaluation              AM-PAC OT "6 Clicks" Daily  Activity     Outcome Measure Help from another person eating meals?: None Help from another person taking care of personal grooming?: A Little Help from another person toileting, which includes using toliet, bedpan, or urinal?: A Little Help from another person bathing (including washing, rinsing, drying)?: A Little Help from another person to put on and taking off regular upper body clothing?: A Little Help from another person to put on and taking off regular lower body clothing?: A Little 6 Click Score: 19   End of Session Equipment Utilized During Treatment: Gait belt;Cervical collar Nurse Communication: Mobility status;Other (comment)(pt requesting eye drops)  Activity Tolerance: Patient tolerated treatment well;Patient limited by pain Patient left: in bed;with call bell/phone within reach  OT Visit Diagnosis: Unsteadiness on feet (R26.81);Other abnormalities of gait and mobility (R26.89);Pain Pain - part of body: (neck)                Time: [id number] OT Time Calculation (min): 24 min Charges:  OT General Charges $OT Visit: 1 Visit OT Evaluation $OT Eval Low Complexity: 1 Low OT Treatments $Self Care/Home Management : 8-22 mins  Gus Rankin, OT Student  Gus Rankin 09/27/2019, 6:09 PM

## 2019-09-27 NOTE — Anesthesia Postprocedure Evaluation (Signed)
Anesthesia Post Note  Patient: Terri Hill  Procedure(s) Performed: Anterior Cervical Decompression/disectomy Fusion - Cervical Three-Cervical Four - Cervical Four-Cervical Five (N/A Spine Cervical)     Patient location during evaluation: PACU Anesthesia Type: General Level of consciousness: awake and alert Pain management: pain level controlled Vital Signs Assessment: post-procedure vital signs reviewed and stable Respiratory status: spontaneous breathing, nonlabored ventilation, respiratory function stable and patient connected to nasal cannula oxygen Cardiovascular status: blood pressure returned to baseline and stable Postop Assessment: no apparent nausea or vomiting Anesthetic complications: no    Last Vitals:  Vitals:   09/27/19 1120 09/27/19 1145  BP: (!) 141/90 125/77  Pulse: 82 70  Resp: (!) 23 17  Temp: (!) 36.3 C 36.8 C  SpO2: 96%     Last Pain:  Vitals:   09/27/19 1145  TempSrc: Oral  PainSc:                  Naveah Brave

## 2019-09-27 NOTE — Anesthesia Procedure Notes (Signed)
Procedure Name: Intubation Date/Time: 09/27/2019 8:13 AM Performed by: Kyung Rudd, CRNA Pre-anesthesia Checklist: Patient identified, Emergency Drugs available, Suction available, Patient being monitored and Timeout performed Patient Re-evaluated:Patient Re-evaluated prior to induction Oxygen Delivery Method: Circle system utilized Preoxygenation: Pre-oxygenation with 100% oxygen Induction Type: IV induction Ventilation: Mask ventilation without difficulty Laryngoscope Size: Mac and 3 Grade View: Grade I Tube type: Oral Tube size: 7.0 mm Number of attempts: 1 Airway Equipment and Method: Stylet Placement Confirmation: ETT inserted through vocal cords under direct vision,  positive ETCO2 and breath sounds checked- equal and bilateral Secured at: 20 cm Tube secured with: Tape Dental Injury: Teeth and Oropharynx as per pre-operative assessment

## 2019-09-28 MED ORDER — CYCLOBENZAPRINE HCL 10 MG PO TABS: 10.0000 mg | ORAL_TABLET | Freq: Three times a day (TID) | ORAL | 0 refills | Status: AC | PRN

## 2019-09-28 MED ORDER — HYDROCODONE-ACETAMINOPHEN 5-325 MG PO TABS: 1.0000 | ORAL_TABLET | ORAL | 0 refills | Status: AC | PRN

## 2019-09-28 MED ORDER — HYDROXYZINE HCL 25 MG PO TABS: 25.0000 mg | ORAL_TABLET | Freq: Four times a day (QID) | ORAL | Status: DC | PRN

## 2019-09-28 NOTE — Progress Notes (Signed)
Occupational Therapy Treatment Patient Details Name: Terri [facility] MRN: [id number] DOB: [birth year] Today's Date: 09/28/2019    History of present illness pt is 1 female s/p C3-5 anterior cervical discectomy with interbody fusion and removal of C5-7. PMH including prior back surgery and breast lumpectomy.    OT comments  Pt progressing towards OT goals. Pt continues to present with decreased balance, strength, and increased pain. Provided education on brace management and compensatory techniques for ADLs. Pt performed UB dressing, LB dressing, and functional mobility with supervision-min guard A and VCs for safety and using compensatory techniques. Continue to recommend dc home with no OT follow up once medically stable per MD. Will follow acutely as admitted.    Follow Up Recommendations  No OT follow up    Equipment Recommendations  3 in 1 bedside commode    Recommendations for Other Services PT consult    Precautions / Restrictions Precautions Precautions: Cervical Precaution Booklet Issued: Yes (comment) Precaution Comments: pt recalled all precautions Required Braces or Orthoses: Cervical Brace Cervical Brace: Soft collar;At all times       Mobility Bed Mobility Overal bed mobility: Needs Assistance Bed Mobility: Rolling;Sidelying to Sit;Sit to Sidelying Rolling: Supervision Sidelying to sit: Supervision     Sit to sidelying: Supervision General bed mobility comments: supervision for safety  Transfers Overall transfer level: Needs assistance Equipment used: None Transfers: Sit to/from Stand Sit to Stand: Min guard         General transfer comment: min guard A for safety    Balance Overall balance assessment: Needs assistance Sitting-balance support: No upper extremity supported;Feet unsupported Sitting balance-Leahy Scale: Fair     Standing balance support: No upper extremity supported;During functional activity Standing balance-Leahy Scale:  Fair Standing balance comment: pt able to maintain static standing without external support, but would reach for objects when walking. provided education on how reaching out of comfort zone can decrease balance. pt verbalized understanding and stopped reaching for objects while walking                           ADL either performed or assessed with clinical judgement   ADL Overall ADL's : Needs assistance/impaired                 Upper Body Dressing : Supervision/safety;Sitting Upper Body Dressing Details (indicate cue type and reason): provided education on brace management and compensatory techniques. required supervision for safety Lower Body Dressing: Min guard;Sit to/from stand Lower Body Dressing Details (indicate cue type and reason): performed using compensatory techniques with min guard A for safety             Functional mobility during ADLs: Min guard General ADL Comments: required supervision-min guard A and VCs for compensatory techniques     Vision       Perception     Praxis      Cognition Arousal/Alertness: Awake/alert Behavior During Therapy: WFL for tasks assessed/performed Overall Cognitive Status: Within Functional Limits for tasks assessed                                          Exercises     Shoulder Instructions       General Comments      Pertinent Vitals/ Pain       Pain Assessment: Faces Faces Pain Scale: Hurts a little bit Pain  Location: neck- surgical site Pain Descriptors / Indicators: Aching;Discomfort;Grimacing Pain Intervention(s): Monitored during session;Repositioned  Home Living                                          Prior Functioning/Environment              Frequency  Min 2X/week        Progress Toward Goals  OT Goals(current goals can now be found in the care plan section)  Progress towards OT goals: Progressing toward goals  Acute Rehab OT  Goals Patient Stated Goal: get better OT Goal Formulation: With patient Time For Goal Achievement: 10/11/19 Potential to Achieve Goals: Good ADL Goals Pt Will Perform Grooming: with supervision;standing Pt Will Perform Upper Body Dressing: with supervision;sitting Pt Will Perform Tub/Shower Transfer: Shower transfer;with supervision;ambulating;shower seat  Plan Discharge plan remains appropriate;Frequency remains appropriate    Co-evaluation                 AM-PAC OT "6 Clicks" Daily Activity     Outcome Measure   Help from another person eating meals?: None Help from another person taking care of personal grooming?: None Help from another person toileting, which includes using toliet, bedpan, or urinal?: None Help from another person bathing (including washing, rinsing, drying)?: None Help from another person to put on and taking off regular upper body clothing?: None Help from another person to put on and taking off regular lower body clothing?: None 6 Click Score: 24    End of Session Equipment Utilized During Treatment: Cervical collar  OT Visit Diagnosis: Unsteadiness on feet (R26.81);Other abnormalities of gait and mobility (R26.89);Pain Pain - part of body: (neck)   Activity Tolerance Patient tolerated treatment well   Patient Left in bed;with call bell/phone within reach   Nurse Communication Mobility status        Time: [id number] OT Time Calculation (min): 20 min  Charges: OT General Charges $OT Visit: 1 Visit OT Treatments $Self Care/Home Management : 8-22 mins  Gus Rankin, OT Student  Gus Rankin 09/28/2019, 10:55 AM

## 2019-09-28 NOTE — Plan of Care (Signed)
Patient alert and oriented, mae's well, voiding adequate amount of urine, swallowing without difficulty, no c/o pain at time of discharge. Patient discharged home with family. Script and discharged instructions given to patient. Patient and family stated understanding of instructions given. Patient has an appointment with Dr. Pool  

## 2019-09-28 NOTE — Discharge Instructions (Addendum)

## 2019-09-28 NOTE — TOC Transition Note (Signed)
Transition of Care [facility]) - CM/SW Discharge Note   Patient Details  Name: Terri [facility] MRN: [id number] Date of Birth: 17-Jun-1951  Transition of Care [facility]) CM/SW Contact:  Pollie Friar, RN Phone Number: 09/28/2019, 11:03 AM   Clinical Narrative:    Pt discharging home with self care. No f/u per OT. Bedside RN to follow up on 3 in 1.  Pt has transportation home.   Final next level of care: Home/Self Care Barriers to Discharge: No Barriers Identified   Patient Goals and CMS Choice        Discharge Placement                       Discharge Plan and Services                                     Social Determinants of Health (SDOH) Interventions     Readmission Risk Interventions No flowsheet data found.

## 2019-09-28 NOTE — Discharge Summary (Signed)
Physician Discharge Summary  Patient ID: Terri [facility] MRN: [id number] DOB/AGE: 03-16-1951 69 y.o.  Admit date: 09/27/2019 Discharge date: 09/28/2019  Admission Diagnoses:  Discharge Diagnoses:  Active Problems:   Cervical myelopathy [facility])   Discharged Condition: good  [facility] Course: [facility] to [facility] where she underwent uncomplicated two-level anterior cervical decompression and fusion.  Postop Truman Hayward doing well.  Preoperative neck and upper extremity symptoms improved.  Standing and walking better.  Strength and sensation much improved.  Swallowing well.  No wound issues.  Voiding well.  Consults:   Significant Diagnostic Studies:   Treatments:   Discharge Exam: Blood pressure 105/67, pulse 70, temperature 98.3 F (36.8 C), temperature source Oral, resp. rate 16, height 5\' 2"  (1.575 m), weight 38.6 kg, SpO2 98 %. Awake and alert.  Oriented and appropriate.  Speech fluent.  Judgment insight intact.  Cranial nerve function normal bilateral.  Motor and sensory function of the extremities normal.  Wound clean and dry.  Chest and abdomen benign. Disposition: Discharge disposition: 01-Home or Self Care        Allergies as of 09/28/2019      Reactions   Sulfa Antibiotics Nausea And Vomiting      Medication List    TAKE these medications   cyclobenzaprine 10 MG tablet Commonly known as: FLEXERIL Take 1 tablet (10 mg total) by mouth 3 (three) times daily as needed for muscle spasms.   HYDROcodone-acetaminophen 5-325 MG tablet Commonly known as: NORCO/VICODIN Take 1-2 tablets by mouth every 4 (four) hours as needed for moderate pain ((score 4 to 6)).   multivitamin with minerals Tabs tablet Take 1 tablet by mouth daily.   naproxen sodium 220 MG tablet Commonly known as: ALEVE Take 220 mg by mouth daily as needed (pain).   ondansetron 4 MG tablet Commonly known as: ZOFRAN Take 4 mg by mouth every 8 (eight) hours as needed for nausea/vomiting.    traMADol 50 MG tablet Commonly known as: ULTRAM Take 50 mg by mouth every 6 (six) hours as needed for moderate pain.        Signed: Cooper Render Milen [facility] 09/28/2019, 8:16 AM

## 2019-09-29 ENCOUNTER — Encounter (HOSPITAL_COMMUNITY): Payer: Self-pay | Admitting: Neurosurgery

## 2020-02-09 ENCOUNTER — Other Ambulatory Visit: Payer: Self-pay | Admitting: Neurosurgery

## 2020-02-09 ENCOUNTER — Other Ambulatory Visit (HOSPITAL_COMMUNITY): Payer: Self-pay | Admitting: Neurosurgery

## 2020-02-09 DIAGNOSIS — M5416 Radiculopathy, lumbar region: Secondary | ICD-10-CM

## 2020-03-03 ENCOUNTER — Ambulatory Visit (HOSPITAL_COMMUNITY): Payer: Medicare PPO

## 2020-03-08 ENCOUNTER — Ambulatory Visit (HOSPITAL_COMMUNITY)
Admission: RE | Admit: 2020-03-08 | Discharge: 2020-03-08 | Disposition: A | Discharge status: Home / Self Care | Payer: Medicare PPO | Source: Ambulatory Visit | Attending: Neurosurgery | Admitting: Neurosurgery

## 2020-03-08 ENCOUNTER — Other Ambulatory Visit: Payer: Self-pay

## 2020-03-08 DIAGNOSIS — M5416 Radiculopathy, lumbar region: Secondary | ICD-10-CM | POA: Insufficient documentation

## 2020-10-16 DIAGNOSIS — R2681 Unsteadiness on feet: Secondary | ICD-10-CM | POA: Diagnosis not present

## 2020-10-16 DIAGNOSIS — M25551 Pain in right hip: Secondary | ICD-10-CM | POA: Diagnosis not present

## 2020-10-16 DIAGNOSIS — Z471 Aftercare following joint replacement surgery: Secondary | ICD-10-CM | POA: Diagnosis not present

## 2020-10-18 DIAGNOSIS — M25551 Pain in right hip: Secondary | ICD-10-CM | POA: Diagnosis not present

## 2020-10-18 DIAGNOSIS — Z471 Aftercare following joint replacement surgery: Secondary | ICD-10-CM | POA: Diagnosis not present

## 2020-10-18 DIAGNOSIS — R2681 Unsteadiness on feet: Secondary | ICD-10-CM | POA: Diagnosis not present

## 2020-10-23 DIAGNOSIS — Z471 Aftercare following joint replacement surgery: Secondary | ICD-10-CM | POA: Diagnosis not present

## 2020-10-23 DIAGNOSIS — M25551 Pain in right hip: Secondary | ICD-10-CM | POA: Diagnosis not present

## 2020-10-23 DIAGNOSIS — R2681 Unsteadiness on feet: Secondary | ICD-10-CM | POA: Diagnosis not present

## 2020-11-06 DIAGNOSIS — Z471 Aftercare following joint replacement surgery: Secondary | ICD-10-CM | POA: Diagnosis not present

## 2020-11-06 DIAGNOSIS — M25551 Pain in right hip: Secondary | ICD-10-CM | POA: Diagnosis not present

## 2020-11-06 DIAGNOSIS — R2681 Unsteadiness on feet: Secondary | ICD-10-CM | POA: Diagnosis not present

## 2020-11-20 DIAGNOSIS — M1611 Unilateral primary osteoarthritis, right hip: Secondary | ICD-10-CM | POA: Diagnosis not present

## 2020-11-20 DIAGNOSIS — M67472 Ganglion, left ankle and foot: Secondary | ICD-10-CM | POA: Diagnosis not present

## 2020-12-19 DIAGNOSIS — J069 Acute upper respiratory infection, unspecified: Secondary | ICD-10-CM | POA: Diagnosis not present

## 2020-12-19 DIAGNOSIS — Z20828 Contact with and (suspected) exposure to other viral communicable diseases: Secondary | ICD-10-CM | POA: Diagnosis not present

## 2021-01-23 DIAGNOSIS — H2589 Other age-related cataract: Secondary | ICD-10-CM | POA: Diagnosis not present

## 2021-01-23 DIAGNOSIS — H04123 Dry eye syndrome of bilateral lacrimal glands: Secondary | ICD-10-CM | POA: Diagnosis not present

## 2021-01-23 DIAGNOSIS — H25813 Combined forms of age-related cataract, bilateral: Secondary | ICD-10-CM | POA: Diagnosis not present

## 2021-02-21 DIAGNOSIS — T8484XA Pain due to internal orthopedic prosthetic devices, implants and grafts, initial encounter: Secondary | ICD-10-CM | POA: Diagnosis not present

## 2021-03-20 DIAGNOSIS — Z681 Body mass index (BMI) 19 or less, adult: Secondary | ICD-10-CM | POA: Diagnosis not present

## 2021-03-20 DIAGNOSIS — R300 Dysuria: Secondary | ICD-10-CM | POA: Diagnosis not present

## 2021-04-09 DIAGNOSIS — Z20828 Contact with and (suspected) exposure to other viral communicable diseases: Secondary | ICD-10-CM | POA: Diagnosis not present

## 2021-04-11 DIAGNOSIS — H269 Unspecified cataract: Secondary | ICD-10-CM | POA: Diagnosis not present

## 2021-04-11 DIAGNOSIS — H25811 Combined forms of age-related cataract, right eye: Secondary | ICD-10-CM | POA: Diagnosis not present

## 2021-04-20 DIAGNOSIS — H2589 Other age-related cataract: Secondary | ICD-10-CM | POA: Diagnosis not present

## 2021-04-20 DIAGNOSIS — H25812 Combined forms of age-related cataract, left eye: Secondary | ICD-10-CM | POA: Diagnosis not present

## 2021-05-02 DIAGNOSIS — H52202 Unspecified astigmatism, left eye: Secondary | ICD-10-CM | POA: Diagnosis not present

## 2021-05-02 DIAGNOSIS — H2512 Age-related nuclear cataract, left eye: Secondary | ICD-10-CM | POA: Diagnosis not present

## 2021-05-02 DIAGNOSIS — H2589 Other age-related cataract: Secondary | ICD-10-CM | POA: Diagnosis not present

## 2021-05-02 DIAGNOSIS — H25812 Combined forms of age-related cataract, left eye: Secondary | ICD-10-CM | POA: Diagnosis not present

## 2021-07-21 IMAGING — MR MR LUMBAR SPINE W/O CM
4 of 5 series · 26 of 48 positions shown · non-contrast
Comparison: Lumbar radiographs dated 06/30/2019

CLINICAL DATA: Back pain and numbness in the right leg.

EXAM:
MRI LUMBAR SPINE WITHOUT CONTRAST
TECHNIQUE: Multiplanar, multisequence MR imaging of the lumbar spine was
performed. No intravenous contrast was administered.

[Series 4: T1 · sagittal · 4.0mm · 0.55mm/px · 5 of 13 slices shown (1 of 2)]
[im 1/13]
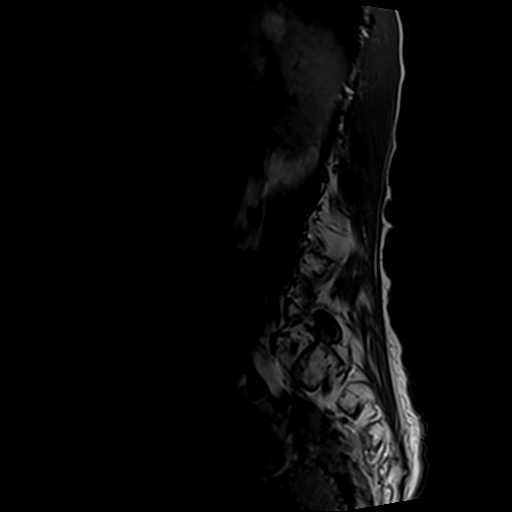
[im 4/13]
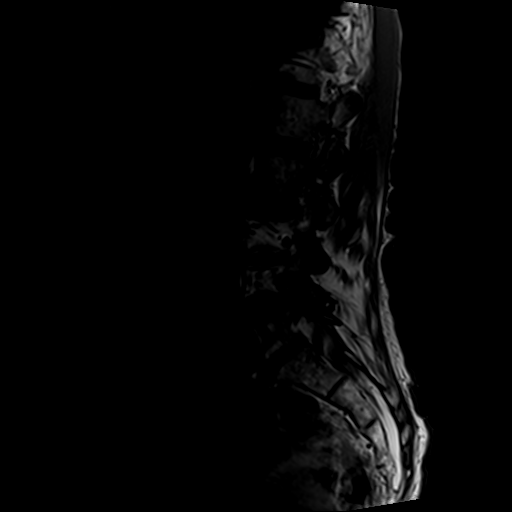
[im 7/13]
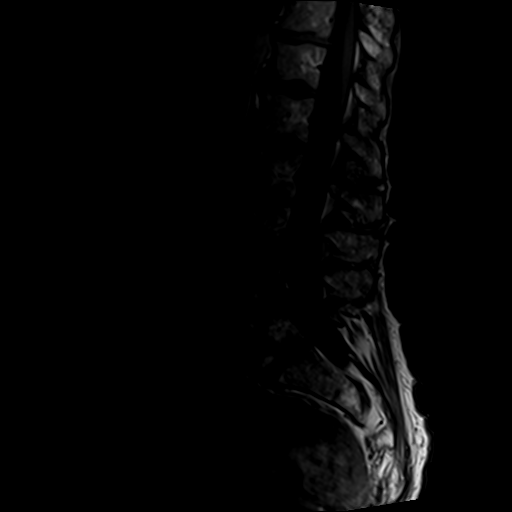
[im 10/13]
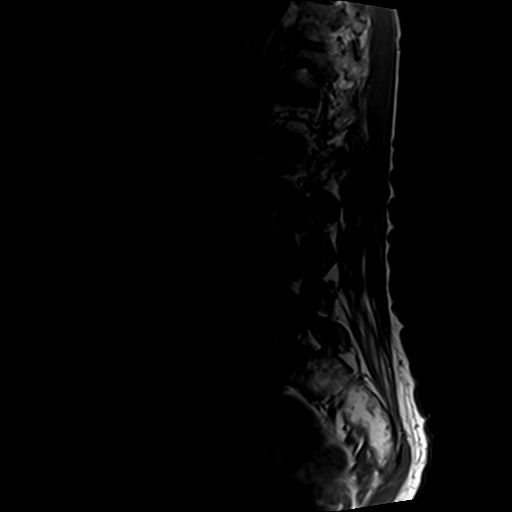
[im 13/13]
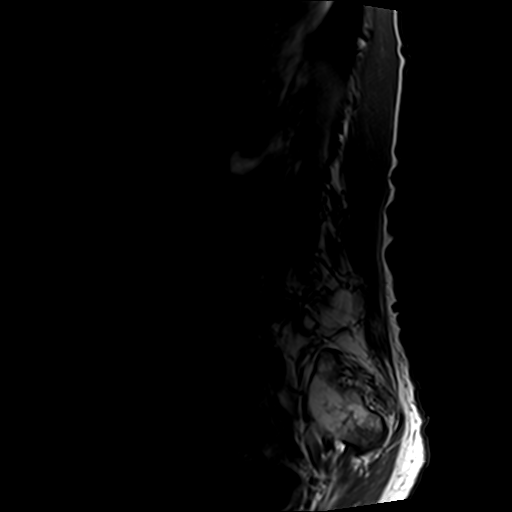

[Series 5: T2 post-contrast · sagittal · 4.0mm · 0.55mm/px · 6 of 13 slices shown]
[im 1/13]
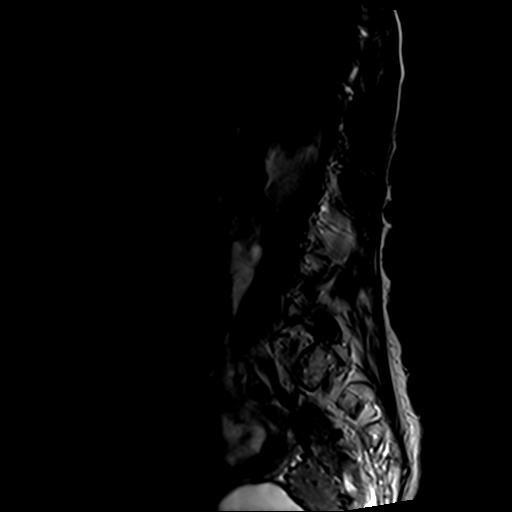
[im 3/13]
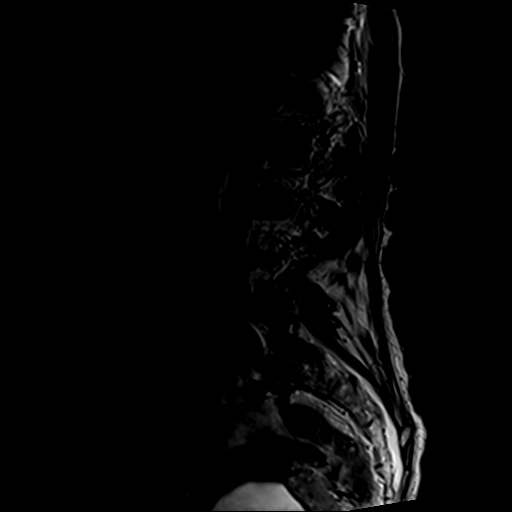
[im 5/13]
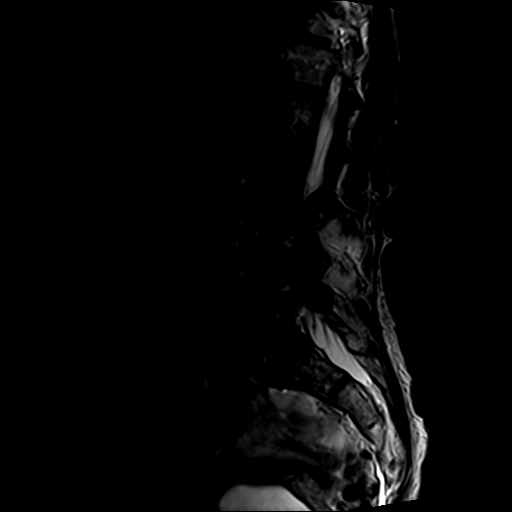
[im 8/13]
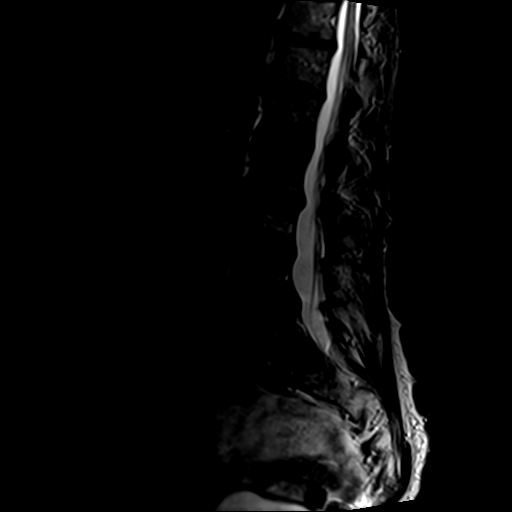
[im 10/13]
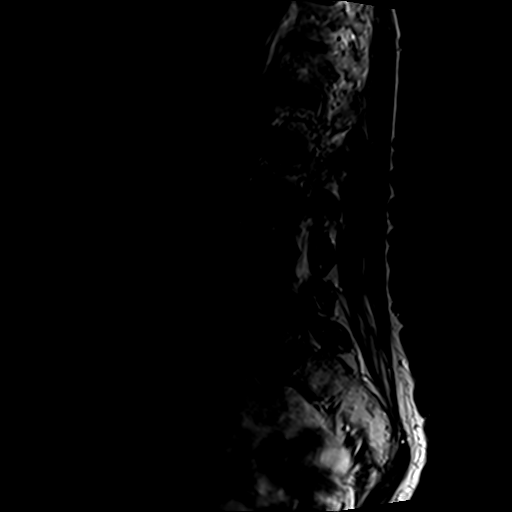
[im 13/13]
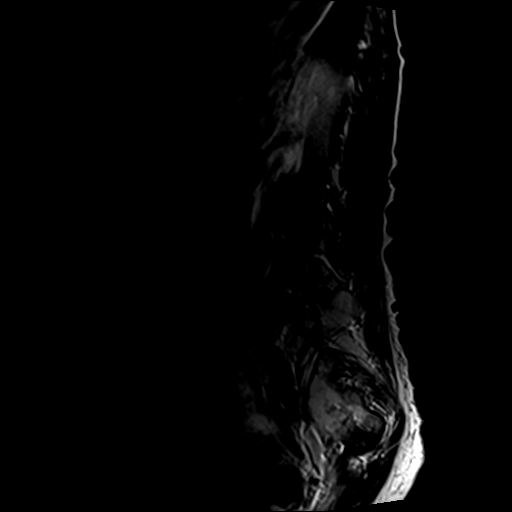

[Series 6: T2 · axial · 4.0mm · 0.70mm/px · z∈[-108,+76]mm · 10 of 36 slices shown]
[im 3/36]
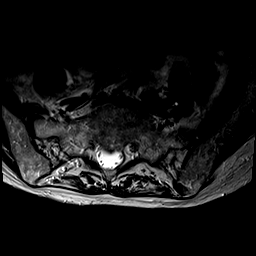
[im 5/36]
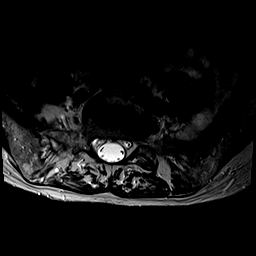
[im 8/36]
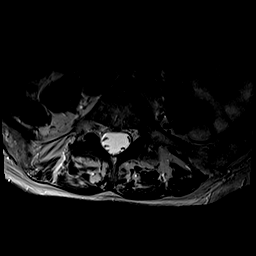
[im 12/36]
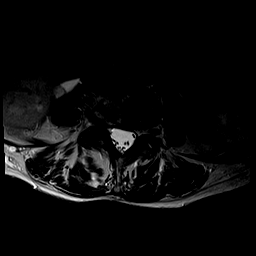
[im 17/36]
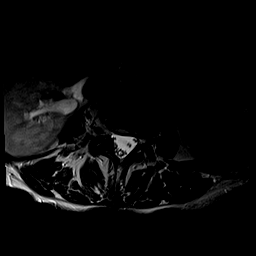
[im 19/36]
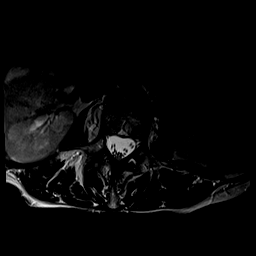
[im 22/36]
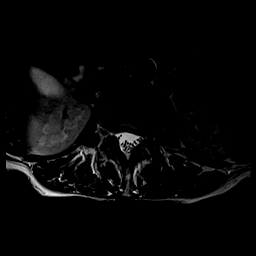
[im 26/36]
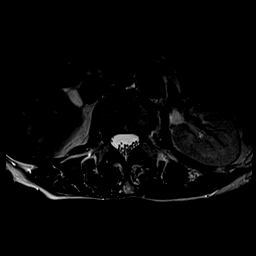
[im 31/36]
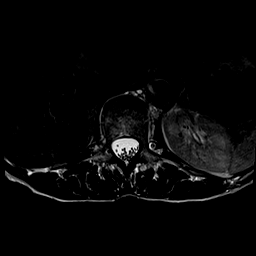
[im 36/36]
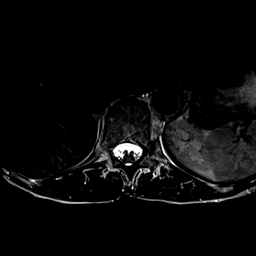

[Series 7: T1 · axial · 4.0mm · 0.35mm/px · z∈[-108,+51]mm · 5 of 36 slices shown (2 of 2)]
[im 3/36]
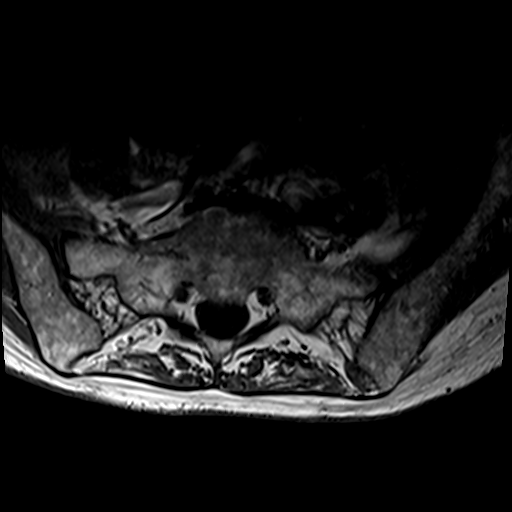
[im 5/36]
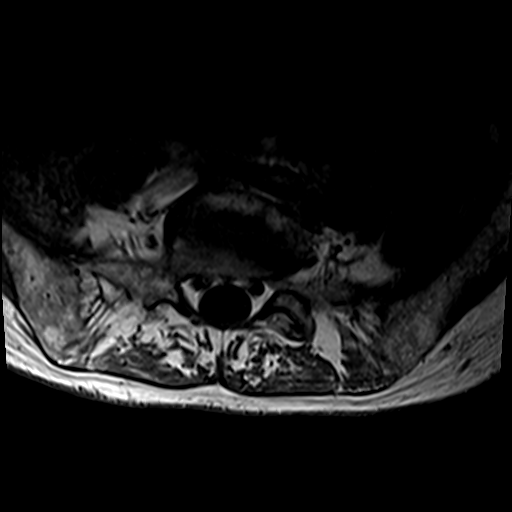
[im 8/36]
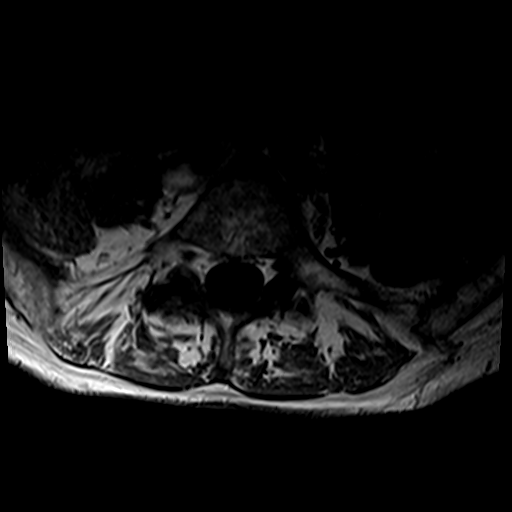
[im 19/36]
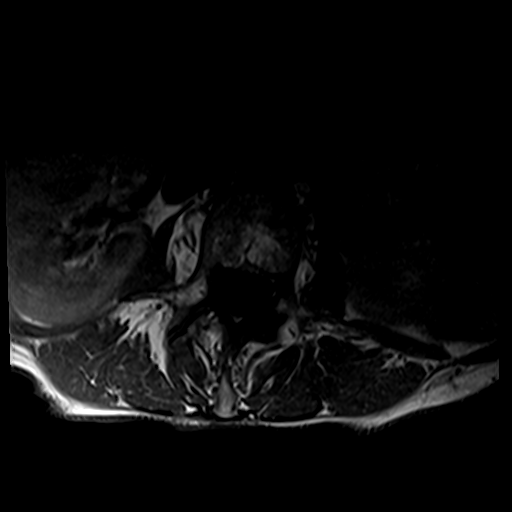
[im 31/36]
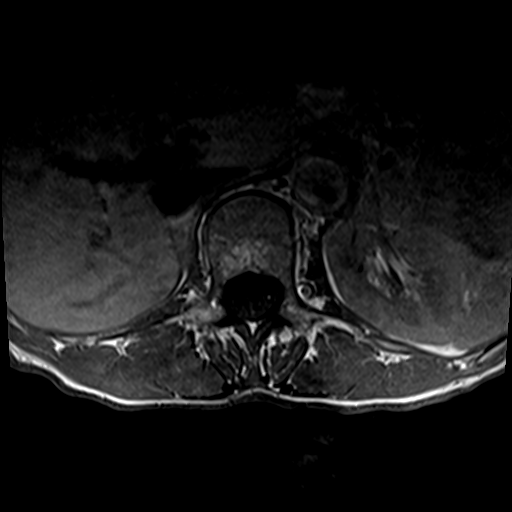

[26 of 48 positions shown; findings below may reference images not displayed]

FINDINGS: Segmentation:  Standard.

Alignment: Slight lumbar scoliosis with convexity to the left
centered at L4. Minimal retrolisthesis at L2-3 and L4-5.

Vertebrae:  No fracture, evidence of discitis, or bone lesion.

Conus medullaris and cauda equina: Conus extends to the L1 level.
Conus and cauda equina appear normal.

Paraspinal and other soft tissues: Negative.

Disc levels:

T12-L1: Disc desiccation. No tiny disc bulge to the left with no
neural impingement.

L1-2: Normal.

L2-3: Disc desiccation with disc space narrowing. Broad-based disc
bulge with a small protrusion into the left neural foramen. The left
L2 nerve exits without impingement.

L3-4: Marked disc space narrowing. Small endplate osteophytes to the
right of midline without focal neural impingement. No significant
foraminal stenosis. Slight degenerative changes of the right facet
joint.

L4-5: Disc space narrowing. Small broad-based disc protrusion
asymmetric to the left without focal neural impingement. Moderate
left foraminal stenosis. However, the L4 nerve appears to exit
without impingement. Minimal degenerative changes of the right facet
joint.

L5-S1: Disc space narrowing. No disc bulging or protrusion. No
neural impingement. No significant facet arthritis.
IMPRESSION: Multilevel degenerative disc disease in the lumbar spine without
focal neural impingement.

No spinal stenosis.

## 2021-09-18 ENCOUNTER — Encounter: Payer: Self-pay | Admitting: Internal Medicine

## 2021-10-04 DIAGNOSIS — R102 Pelvic and perineal pain: Secondary | ICD-10-CM | POA: Diagnosis not present

## 2021-10-04 DIAGNOSIS — R350 Frequency of micturition: Secondary | ICD-10-CM | POA: Diagnosis not present

## 2021-10-04 DIAGNOSIS — Z681 Body mass index (BMI) 19 or less, adult: Secondary | ICD-10-CM | POA: Diagnosis not present

## 2021-10-18 ENCOUNTER — Ambulatory Visit (AMBULATORY_SURGERY_CENTER): Payer: Medicare PPO | Admitting: *Deleted

## 2021-10-18 ENCOUNTER — Other Ambulatory Visit: Payer: Self-pay

## 2021-10-18 ENCOUNTER — Encounter: Payer: Self-pay | Admitting: Internal Medicine

## 2021-10-18 VITALS — Ht 62.0 in | Wt 90.0 lb

## 2021-10-18 DIAGNOSIS — Z1211 Encounter for screening for malignant neoplasm of colon: Secondary | ICD-10-CM

## 2021-10-18 MED ORDER — NA SULFATE-K SULFATE-MG SULF 17.5-3.13-1.6 GM/177ML PO SOLN: 1.0000 | Freq: Once | ORAL | 0 refills | Status: AC

## 2021-10-18 NOTE — Progress Notes (Signed)
Virtual pre visit completed over telephone.  Instructions mailed to patient.  No egg or soy allergy known to patient  No issues known to pt with past sedation with any surgeries or procedures Patient denies ever being told they had issues or difficulty with intubation  No FH of Malignant Hyperthermia Pt is not on diet pills Pt is not on  home 02  Pt is not on blood thinners  Pt denies issues with constipation  No A fib or A flutter  Pt is fully vaccinated  for Covid   Discussed with pt there will be an out-of-pocket cost for prep and that varies from $0 to 70 +  dollars - pt verbalized understanding   Due to the COVID-19 pandemic we are asking patients to follow certain guidelines in [facility] and the [facility]   Pt aware of COVID protocols and [facility] guidelines

## 2021-10-31 ENCOUNTER — Encounter: Payer: Self-pay | Admitting: Certified Registered Nurse Anesthetist

## 2021-11-01 ENCOUNTER — Ambulatory Visit (AMBULATORY_SURGERY_CENTER): Payer: Medicare PPO | Admitting: Internal Medicine

## 2021-11-01 ENCOUNTER — Other Ambulatory Visit: Payer: Self-pay

## 2021-11-01 ENCOUNTER — Encounter: Payer: Self-pay | Admitting: Internal Medicine

## 2021-11-01 VITALS — BP 100/68 | HR 76 | Temp 97.3°F | Resp 16 | Ht 62.0 in | Wt 90.0 lb

## 2021-11-01 DIAGNOSIS — J45909 Unspecified asthma, uncomplicated: Secondary | ICD-10-CM | POA: Diagnosis not present

## 2021-11-01 DIAGNOSIS — D120 Benign neoplasm of cecum: Secondary | ICD-10-CM

## 2021-11-01 DIAGNOSIS — Z1211 Encounter for screening for malignant neoplasm of colon: Secondary | ICD-10-CM

## 2021-11-01 MED ORDER — SODIUM CHLORIDE 0.9 % IV SOLN: 500.0000 mL | Freq: Once | INTRAVENOUS | Status: DC

## 2021-11-01 NOTE — Progress Notes (Signed)
Pt's states no medical or surgical changes since previsit or office visit. VS assessed by C.W 

## 2021-11-01 NOTE — Progress Notes (Signed)
GASTROENTEROLOGY PROCEDURE H&P NOTE   Primary Care Physician: Patient, No Pcp Per (Inactive)    Reason for Procedure:  Colon cancer screening  Plan:    Screening colonoscopy  Patient is appropriate for endoscopic procedure(s) in the ambulatory ([facility]) setting.  The nature of the procedure, as well as the risks, benefits, and alternatives were carefully and thoroughly reviewed with the patient. Ample time for discussion and questions allowed. The patient understood, was satisfied, and agreed to proceed.     HPI: Terri [facility] is a 70 y.o. female who presents for screening colonoscopy.  No prior colonoscopy.  Tolerated the prep.  No recent chest pain or shortness of breath.  Her husband died of colon cancer.  No family history of colon cancer per her.  Past Medical History:  Diagnosis Date   Asthma    2019   Cataract    Pneumonia    PONV (postoperative nausea and vomiting)     Past Surgical History:  Procedure Laterality Date   ANTERIOR CERVICAL DECOMP/DISCECTOMY FUSION N/A 09/27/2019   Procedure: Anterior Cervical Decompression/disectomy Fusion - Cervical Three-Cervical Four - Cervical Four-Cervical Five;  Surgeon: Earnie Larsson, MD;  Location: [facility];  Service: Neurosurgery;  Laterality: N/A;   APPENDECTOMY     48 years ago   BACK SURGERY     ~18 years ago   BREAST LUMPECTOMY Bilateral    more than 10 years ago   TONSILLECTOMY     70 years old   TOTAL HIP ARTHROPLASTY Bilateral     Prior to Admission medications   Medication Sig Start Date End Date Taking? Authorizing Provider  Multiple Vitamin (MULTIVITAMIN WITH MINERALS) TABS tablet Take 1 tablet by mouth daily.   Yes [provider]  naproxen sodium (ALEVE) 220 MG tablet Take 220 mg by mouth daily as needed (pain).   Yes [provider]  cyclobenzaprine (FLEXERIL) 10 MG tablet Take 1 tablet (10 mg total) by mouth 3 (three) times daily as needed for muscle spasms. Patient not taking: Reported on  10/18/2021 09/28/19   Earnie Larsson, MD  HYDROcodone-acetaminophen (NORCO/VICODIN) 5-325 MG tablet Take 1-2 tablets by mouth every 4 (four) hours as needed for moderate pain ((score 4 to 6)). Patient not taking: Reported on 10/18/2021 09/28/19   Earnie Larsson, MD  ondansetron (ZOFRAN) 4 MG tablet Take 4 mg by mouth every 8 (eight) hours as needed for nausea/vomiting. Patient not taking: Reported on 10/18/2021 08/26/19   [provider]  traMADol (ULTRAM) 50 MG tablet Take 50 mg by mouth every 6 (six) hours as needed for moderate pain.  Patient not taking: Reported on 10/18/2021 08/09/19   [provider]    Current Outpatient Medications  Medication Sig Dispense Refill   Multiple Vitamin (MULTIVITAMIN WITH MINERALS) TABS tablet Take 1 tablet by mouth daily.     naproxen sodium (ALEVE) 220 MG tablet Take 220 mg by mouth daily as needed (pain).     cyclobenzaprine (FLEXERIL) 10 MG tablet Take 1 tablet (10 mg total) by mouth 3 (three) times daily as needed for muscle spasms. (Patient not taking: Reported on 10/18/2021) 30 tablet 0   HYDROcodone-acetaminophen (NORCO/VICODIN) 5-325 MG tablet Take 1-2 tablets by mouth every 4 (four) hours as needed for moderate pain ((score 4 to 6)). (Patient not taking: Reported on 10/18/2021) 50 tablet 0   ondansetron (ZOFRAN) 4 MG tablet Take 4 mg by mouth every 8 (eight) hours as needed for nausea/vomiting. (Patient not taking: Reported on 10/18/2021)  traMADol (ULTRAM) 50 MG tablet Take 50 mg by mouth every 6 (six) hours as needed for moderate pain.  (Patient not taking: Reported on 10/18/2021)     Current Facility-Administered Medications  Medication Dose Route Frequency Provider Last Rate Last Admin   0.9 %  sodium chloride infusion  500 mL Intravenous Once Sherrod Toothman, Lajuan Lines, MD        Allergies as of 11/01/2021 - Review Complete 11/01/2021  Allergen Reaction Noted   Sulfa antibiotics Nausea And Vomiting 02/15/2014    Family History  Problem Relation  Age of Onset   Colon polyps Neg Hx    Colon cancer Neg Hx    Esophageal cancer Neg Hx    Stomach cancer Neg Hx    Rectal cancer Neg Hx     Social History   Socioeconomic History   Marital status: Widowed    Spouse name: Not on file   Number of children: Not on file   Years of education: Not on file   Highest education level: Not on file  Occupational History   Not on file  Tobacco Use   Smoking status: Never   Smokeless tobacco: Never  Vaping Use   Vaping Use: Never used  Substance and Sexual Activity   Alcohol use: Never   Drug use: Never   Sexual activity: Not on file  Other Topics Concern   Not on file  Social History Narrative   Not on file   Social Determinants of Health   Financial Resource Strain: Not on file  Food Insecurity: Not on file  Transportation Needs: Not on file  Physical Activity: Not on file  Stress: Not on file  Social Connections: Not on file  Intimate Partner Violence: Not on file    Physical Exam: Vital signs in last 24 hours: @BP  130/68   Pulse 73   Temp (!) 97.3 F (36.3 C) (Skin)   Ht 5\' 2"  (1.575 m)   Wt 90 lb (40.8 kg)   SpO2 99%   BMI 16.46 kg/m  GEN: NAD EYE: Sclerae anicteric ENT: MMM CV: Non-tachycardic Pulm: CTA b/l GI: Soft, NT/ND NEURO:  Alert & Oriented x 3   Zenovia Jarred, MD [facility]  11/01/2021 11:17 AM

## 2021-11-01 NOTE — Op Note (Signed)
[facility] Patient Name: Terri [facility] Procedure Date: 11/01/2021 11:19 AM MRN: [id number] Endoscopist: Jerene Bears , MD Age: 70 Referring MD:  Date of Birth: September 02, 1951 Gender: Female Account #: [id number] Procedure:                Colonoscopy Indications:              Screening for colorectal malignant neoplasm, This                            is the patient's first colonoscopy Medicines:                Monitored Anesthesia Care Procedure:                Pre-Anesthesia Assessment:                           - Prior to the procedure, a History and Physical                            was performed, and patient medications and                            allergies were reviewed. The patient's tolerance of                            previous anesthesia was also reviewed. The risks                            and benefits of the procedure and the sedation                            options and risks were discussed with the patient.                            All questions were answered, and informed consent                            was obtained. Prior Anticoagulants: The patient has                            taken no previous anticoagulant or antiplatelet                            agents. ASA Grade Assessment: II - A patient with                            mild systemic disease. After reviewing the risks                            and benefits, the patient was deemed in                            satisfactory condition to undergo the procedure.  After obtaining informed consent, the colonoscope                            was passed under direct vision. Throughout the                            procedure, the patient's blood pressure, pulse, and                            oxygen saturations were monitored continuously. The                            PCF-HQ190L Colonoscope was introduced through the                            anus and advanced to the  cecum, identified by                            appendiceal orifice and ileocecal valve. The                            colonoscopy was performed without difficulty. The                            patient tolerated the procedure well. The quality                            of the bowel preparation was excellent. The                            ileocecal valve, appendiceal orifice, and rectum                            were photographed. Scope In: 11:33:19 AM Scope Out: 11:52:11 AM Scope Withdrawal Time: 0 hours 12 minutes 10 seconds  Total Procedure Duration: 0 hours 18 minutes 52 seconds  Findings:                 The digital rectal exam was normal.                           A 6 mm polyp was found in the cecum. The polyp was                            sessile. The polyp was removed with a cold snare.                            Resection and retrieval were complete.                           Multiple small and large-mouthed diverticula were                            found in the sigmoid colon and transverse colon.  Internal hemorrhoids were found during                            retroflexion. The hemorrhoids were small. Complications:            No immediate complications. Estimated Blood Loss:     Estimated blood loss: none. Impression:               - One 6 mm polyp in the cecum, removed with a cold                            snare. Resected and retrieved.                           - Diverticulosis in the sigmoid colon and in the                            transverse colon.                           - Small internal hemorrhoids. Recommendation:           - Patient has a contact number available for                            emergencies. The signs and symptoms of potential                            delayed complications were discussed with the                            patient. Return to normal activities tomorrow.                            Written  discharge instructions were provided to the                            patient.                           - Resume previous diet.                           - Continue present medications.                           - Await pathology results.                           - No recommendation at this time regarding repeat                            colonoscopy due to age. Jerene Bears, MD 11/01/2021 12:03:12 PM This report has been signed electronically.

## 2021-11-01 NOTE — Progress Notes (Signed)
Called to room to assist during endoscopic procedure.  Patient ID and intended procedure confirmed with present staff. Received instructions for my participation in the procedure from the performing physician.  

## 2021-11-01 NOTE — Progress Notes (Signed)
Report given to PACU, vss 

## 2021-11-01 NOTE — Patient Instructions (Addendum)
Await pathology  Please read over handout about polyps, hemorrhoids and diverticulosis  Continue your normal medications  YOU HAD AN ENDOSCOPIC PROCEDURE TODAY AT [facility]:   Refer to the procedure report that was given to you for any specific questions about what was found during the examination.  If the procedure report does not answer your questions, please call your gastroenterologist to clarify.  If you requested that your care partner not be given the details of your procedure findings, then the procedure report has been included in a sealed envelope for you to review at your convenience later.  YOU SHOULD EXPECT: Some feelings of bloating in the abdomen. Passage of more gas than usual.  Walking can help get rid of the air that was put into your GI tract during the procedure and reduce the bloating. If you had a lower endoscopy (such as a colonoscopy or flexible sigmoidoscopy) you may notice spotting of blood in your stool or on the toilet paper. If you underwent a bowel prep for your procedure, you may not have a normal bowel movement for a few days.  Please Note:  You might notice some irritation and congestion in your nose or some drainage.  This is from the oxygen used during your procedure.  There is no need for concern and it should clear up in a day or so.  SYMPTOMS TO REPORT IMMEDIATELY:  Following lower endoscopy (colonoscopy or flexible sigmoidoscopy):  Excessive amounts of blood in the stool  Significant tenderness or worsening of abdominal pains  Swelling of the abdomen that is new, acute  Fever of 100F or higher  For urgent or emergent issues, a gastroenterologist can be reached at any hour by calling [phone]. Do not use MyChart messaging for urgent concerns.    DIET:  We do recommend a small meal at first, but then you may proceed to your regular diet.  Drink plenty of fluids but you should avoid alcoholic beverages for 24 hours.  ACTIVITY:   You should plan to take it easy for the rest of today and you should NOT DRIVE or use heavy machinery until tomorrow (because of the sedation medicines used during the test).    FOLLOW UP: Our staff will call the number listed on your records 48-72 hours following your procedure to check on you and address any questions or concerns that you may have regarding the information given to you following your procedure. If we do not reach you, we will leave a message.  We will attempt to reach you two times.  During this call, we will ask if you have developed any symptoms of COVID 19. If you develop any symptoms (ie: fever, flu-like symptoms, shortness of breath, cough etc.) before then, please call [phone].  If you test positive for Covid 19 in the 2 weeks post procedure, please call and report this information to [place].    If any biopsies were taken you will be contacted by phone or by letter within the next 1-3 weeks.  Please call us at [phone] if you have not heard about the biopsies in 3 weeks.    SIGNATURES/CONFIDENTIALITY: You and/or your care partner have signed paperwork which will be entered into your electronic medical record.  These signatures attest to the fact that that the information above on your After Visit Summary has been reviewed and is understood.  Full responsibility of the confidentiality of this discharge information lies with you and/or your care-partner.

## 2021-11-05 ENCOUNTER — Encounter: Payer: Self-pay | Admitting: Internal Medicine

## 2021-11-05 ENCOUNTER — Telehealth: Payer: Self-pay

## 2021-11-05 NOTE — Telephone Encounter (Signed)
  Follow up Call-  Call back number 11/01/2021  Post procedure Call Back phone  # [phone]  Permission to leave phone message Yes     Patient questions:  Do you have a fever, pain , or abdominal swelling? No. Pain Score  0 *  Have you tolerated food without any problems? Yes.    Have you been able to return to your normal activities? Yes.    Do you have any questions about your discharge instructions: Diet   No. Medications  No. Follow up visit  No.  Do you have questions or concerns about your Care? No.  Actions: * If pain score is 4 or above: No action needed, pain <4.  Have you developed a fever since your procedure? No   2.   Have you had an respiratory symptoms (SOB or cough) since your procedure? No    3.   Have you tested positive for COVID 19 since your procedure no   4.   Have you had any family members/close contacts diagnosed with the COVID 19 since your procedure?  No    If yes to any of these questions please route to Joylene John, RN and Joella Prince, RN

## 2021-12-18 DIAGNOSIS — N644 Mastodynia: Secondary | ICD-10-CM | POA: Diagnosis not present

## 2021-12-18 DIAGNOSIS — Z131 Encounter for screening for diabetes mellitus: Secondary | ICD-10-CM | POA: Diagnosis not present

## 2021-12-18 DIAGNOSIS — E559 Vitamin D deficiency, unspecified: Secondary | ICD-10-CM | POA: Diagnosis not present

## 2021-12-18 DIAGNOSIS — Z13820 Encounter for screening for osteoporosis: Secondary | ICD-10-CM | POA: Diagnosis not present

## 2021-12-18 DIAGNOSIS — Z681 Body mass index (BMI) 19 or less, adult: Secondary | ICD-10-CM | POA: Diagnosis not present

## 2021-12-18 DIAGNOSIS — Z13220 Encounter for screening for lipoid disorders: Secondary | ICD-10-CM | POA: Diagnosis not present

## 2022-01-04 DIAGNOSIS — H26493 Other secondary cataract, bilateral: Secondary | ICD-10-CM | POA: Diagnosis not present

## 2022-01-04 DIAGNOSIS — H40003 Preglaucoma, unspecified, bilateral: Secondary | ICD-10-CM | POA: Diagnosis not present

## 2022-01-04 DIAGNOSIS — Z961 Presence of intraocular lens: Secondary | ICD-10-CM | POA: Diagnosis not present

## 2022-01-10 DIAGNOSIS — H26491 Other secondary cataract, right eye: Secondary | ICD-10-CM | POA: Diagnosis not present

## 2022-02-06 DIAGNOSIS — N6322 Unspecified lump in the left breast, upper inner quadrant: Secondary | ICD-10-CM | POA: Diagnosis not present

## 2022-02-06 DIAGNOSIS — R922 Inconclusive mammogram: Secondary | ICD-10-CM | POA: Diagnosis not present

## 2022-02-12 DIAGNOSIS — H26492 Other secondary cataract, left eye: Secondary | ICD-10-CM | POA: Diagnosis not present

## 2022-07-29 DIAGNOSIS — M25572 Pain in left ankle and joints of left foot: Secondary | ICD-10-CM | POA: Diagnosis not present

## 2022-08-23 ENCOUNTER — Other Ambulatory Visit (HOSPITAL_COMMUNITY): Payer: Self-pay | Admitting: Orthopedic Surgery

## 2022-08-23 DIAGNOSIS — M79672 Pain in left foot: Secondary | ICD-10-CM | POA: Diagnosis not present

## 2022-08-23 DIAGNOSIS — M19072 Primary osteoarthritis, left ankle and foot: Secondary | ICD-10-CM

## 2022-08-23 DIAGNOSIS — M6740 Ganglion, unspecified site: Secondary | ICD-10-CM

## 2022-09-12 ENCOUNTER — Ambulatory Visit (HOSPITAL_COMMUNITY)
Admission: RE | Admit: 2022-09-12 | Discharge: 2022-09-12 | Disposition: A | Discharge status: Home / Self Care | Payer: Medicare PPO | Source: Ambulatory Visit | Attending: Orthopedic Surgery | Admitting: Orthopedic Surgery

## 2022-09-12 DIAGNOSIS — M6740 Ganglion, unspecified site: Secondary | ICD-10-CM | POA: Diagnosis not present

## 2022-09-12 DIAGNOSIS — M67472 Ganglion, left ankle and foot: Secondary | ICD-10-CM | POA: Diagnosis not present

## 2022-09-12 DIAGNOSIS — M19072 Primary osteoarthritis, left ankle and foot: Secondary | ICD-10-CM

## 2022-09-12 DIAGNOSIS — M85672 Other cyst of bone, left ankle and foot: Secondary | ICD-10-CM | POA: Diagnosis not present

## 2022-09-12 DIAGNOSIS — Q666 Other congenital valgus deformities of feet: Secondary | ICD-10-CM | POA: Diagnosis not present

## 2022-09-25 ENCOUNTER — Ambulatory Visit: Payer: Medicare PPO | Admitting: Podiatry

## 2022-09-25 DIAGNOSIS — M898X9 Other specified disorders of bone, unspecified site: Secondary | ICD-10-CM

## 2022-09-25 DIAGNOSIS — M6740 Ganglion, unspecified site: Secondary | ICD-10-CM | POA: Diagnosis not present

## 2022-09-25 DIAGNOSIS — Z01818 Encounter for other preprocedural examination: Secondary | ICD-10-CM | POA: Diagnosis not present

## 2022-09-25 NOTE — Progress Notes (Signed)
Subjective:  Patient ID: Terri Hill, female    DOB: [birth year],  MRN: [id number]  Chief Complaint  Patient presents with   Cyst    Left foot cyst - on on going for 1 year . Patient states it is getting worse. Patient states she has had it removed 48-73 years old and it has came back. Constant throbbing pain especially when wearing a tight shoe.     71 y.o. female presents with the above complaint.  Patient presents with multiple ganglion cyst on top of the foot that seems to be causing her a lot of pain.  Patient states she had it removed 4 to 14 years ago but has come back.  Is constant throbbing especially when wearing tight shoes.  She is tried making some shoe gear modification which helped some but does not get relief of pain.  She would like to have it removed.  She denies seeing anyone else prior to seeing me.  She had an MRI done by her primary care doctor has referred me for surgical consultation.   Review of Systems: Negative except as noted in the HPI. Denies N/V/F/Ch.  Past Medical History:  Diagnosis Date   Asthma    2019   Cataract    Pneumonia    PONV (postoperative nausea and vomiting)     Current Outpatient Medications:    cyclobenzaprine (FLEXERIL) 10 MG tablet, Take 1 tablet (10 mg total) by mouth 3 (three) times daily as needed for muscle spasms. (Patient not taking: Reported on 10/18/2021), Disp: 30 tablet, Rfl: 0   HYDROcodone-acetaminophen (NORCO/VICODIN) 5-325 MG tablet, Take 1-2 tablets by mouth every 4 (four) hours as needed for moderate pain ((score 4 to 6)). (Patient not taking: Reported on 10/18/2021), Disp: 50 tablet, Rfl: 0   Multiple Vitamin (MULTIVITAMIN WITH MINERALS) TABS tablet, Take 1 tablet by mouth daily., Disp: , Rfl:    naproxen sodium (ALEVE) 220 MG tablet, Take 220 mg by mouth daily as needed (pain)., Disp: , Rfl:    ondansetron (ZOFRAN) 4 MG tablet, Take 4 mg by mouth every 8 (eight) hours as needed for nausea/vomiting. (Patient not taking:  Reported on 10/18/2021), Disp: , Rfl:    traMADol (ULTRAM) 50 MG tablet, Take 50 mg by mouth every 6 (six) hours as needed for moderate pain.  (Patient not taking: Reported on 10/18/2021), Disp: , Rfl:   Social History   Tobacco Use  Smoking Status Never  Smokeless Tobacco Never    Allergies  Allergen Reactions   Sulfa Antibiotics Nausea And Vomiting   Objective:  There were no vitals filed for this visit. There is no height or weight on file to calculate BMI. Constitutional Well developed. Well nourished.  Vascular Dorsalis pedis pulses palpable bilaterally. Posterior tibial pulses palpable bilaterally. Capillary refill normal to all digits.  No cyanosis or clubbing noted. Pedal hair growth normal.  Neurologic Normal speech. Oriented to person, place, and time. Epicritic sensation to light touch grossly present bilaterally.  Dermatologic Left dorsal midfoot exostosis noted.  Clinically pain on palpation.  Pain on palpation to the cyst.  It is a mobile cyst.  It is not indurated.  Try positive transilluminates.  Clinically unable to superficially appreciate the TM joint cyst however it appears to be much clinically deeper and only appreciable with MRI  No pain at the ankle joint no deep intra-articular pain noted.  No pain on palpation medial lateral gutter  Orthopedic: Normal joint ROM without pain or crepitus bilaterally. No visible deformities.  No bony tenderness.   Radiographs:  Assessment:   1. Ganglion cyst   2. Bony exostosis   3. Encounter for preoperative examination for general surgical procedure    Plan:  Patient was evaluated and treated and all questions answered.  Left dorsal midfoot exostosis and left talonavicular joint ganglion cyst -All questions and concerns were discussed with the patient in extensive detail.  Given the amount of pain that she is having she will benefit from surgical excision of the ganglion cyst at the talonavicular joint as well as  midfoot exostectomy.  I discussed my surgical plan in extensive detail.  Patient will have a long incision on the dorsum of the foot to remove both component.  I discussed with patient she states understanding.  She does not have any ankle pain.  No concern for lateral talar dome lesion may have been an incidental finding -I discussed my preoperative intra postop plan in extensive detail she states understand like to proceed with surgery. -Informed surgical risk consent was reviewed and read aloud to the patient.  I reviewed the films.  I have discussed my findings with the patient in great detail.  I have discussed all risks including but not limited to infection, stiffness, scarring, limp, disability, deformity, damage to blood vessels and nerves, numbness, poor healing, need for braces, arthritis, chronic pain, amputation, death.  All benefits and realistic expectations discussed in great detail.  I have made no promises as to the outcome.  I have provided realistic expectations.  I have offered the patient a 2nd opinion, which they have declined and assured me they preferred to proceed despite the risks   No follow-ups on file.  Left dorsal midfoot exostosis and TN ganlion cycyst ssx shoe

## 2022-09-26 ENCOUNTER — Telehealth: Payer: Self-pay | Admitting: Urology

## 2022-09-26 NOTE — Telephone Encounter (Signed)
DOS - 10/21/22  The following codes do not require a pre-authorization Created on 09/26/2022  Plan Year 12/17/2019 - 12/15/9998   Service info [phone] Excision or curettage of bone cyst or benign tumor, tarsal or metatarsal, except talus or calcaneus; 28090 Excision of lesion, tendon, tendon sheath, or capsule (including synovectomy) (eg, cyst or ganglion); foot

## 2022-10-15 ENCOUNTER — Telehealth: Payer: Self-pay | Admitting: Podiatry

## 2022-10-15 NOTE — Telephone Encounter (Signed)
Returned the call to patient concerning surgical shoe too large, no answer,left voice message to call back.

## 2022-10-15 NOTE — Telephone Encounter (Signed)
Pt lvm and stated that she thinks her surgical shoe is going to be too big and she would like to come in and exchange it for a smaller one.

## 2022-10-18 ENCOUNTER — Telehealth: Payer: Self-pay | Admitting: *Deleted

## 2022-10-18 NOTE — Telephone Encounter (Signed)
Patient came in today for exchange of the darco shoe(med) given for a smaller (sz ).

## 2022-10-21 ENCOUNTER — Other Ambulatory Visit: Payer: Self-pay | Admitting: Podiatry

## 2022-10-21 ENCOUNTER — Encounter: Payer: Self-pay | Admitting: Podiatry

## 2022-10-21 DIAGNOSIS — M67472 Ganglion, left ankle and foot: Secondary | ICD-10-CM | POA: Diagnosis not present

## 2022-10-21 DIAGNOSIS — M25775 Osteophyte, left foot: Secondary | ICD-10-CM | POA: Diagnosis not present

## 2022-10-21 DIAGNOSIS — M84872 Other disorders of continuity of bone, left ankle and foot: Secondary | ICD-10-CM | POA: Diagnosis not present

## 2022-10-21 MED ORDER — IBUPROFEN 800 MG PO TABS: 800.0000 mg | ORAL_TABLET | Freq: Four times a day (QID) | ORAL | 1 refills | Status: AC | PRN

## 2022-10-21 MED ORDER — OXYCODONE-ACETAMINOPHEN 5-325 MG PO TABS: 1.0000 | ORAL_TABLET | ORAL | 0 refills | Status: AC | PRN

## 2022-10-22 ENCOUNTER — Telehealth: Payer: Self-pay

## 2022-10-22 MED ORDER — ONDANSETRON HCL 4 MG PO TABS: 4.0000 mg | ORAL_TABLET | Freq: Three times a day (TID) | ORAL | 0 refills | Status: AC | PRN

## 2022-10-22 NOTE — Telephone Encounter (Signed)
No additional information is needed

## 2022-10-30 ENCOUNTER — Ambulatory Visit (INDEPENDENT_AMBULATORY_CARE_PROVIDER_SITE_OTHER): Payer: Medicare PPO | Admitting: Podiatry

## 2022-10-30 DIAGNOSIS — Z9889 Other specified postprocedural states: Secondary | ICD-10-CM

## 2022-10-30 DIAGNOSIS — M898X9 Other specified disorders of bone, unspecified site: Secondary | ICD-10-CM

## 2022-10-30 DIAGNOSIS — M6740 Ganglion, unspecified site: Secondary | ICD-10-CM

## 2022-10-30 NOTE — Progress Notes (Signed)
Subjective:  Patient ID: Terri Hill, female    DOB: 1951/08/26,  MRN: [id number]  Chief Complaint  Patient presents with   Routine Post Op    POV #1 DOS 10/21/2022 LT EXCISION OF GANGLION CYST & LT MIDFOOT EXOSTOSIS    DOS: 10/21/2022 Procedure: Left excision of ganglion cyst and left midfoot exostosis exostectomy patient  71 y.o. female returns for post-op check.  Patient states that she is doing okay pain minimally controlled with ibuprofen.  Bandages clean dry and intact.  Denies any other acute complaints weightbearing as tolerated with a surgical shoe.  Review of Systems: Negative except as noted in the HPI. Denies N/V/F/Ch.  Past Medical History:  Diagnosis Date   Asthma    2019   Cataract    Pneumonia    PONV (postoperative nausea and vomiting)     Current Outpatient Medications:    cyclobenzaprine (FLEXERIL) 10 MG tablet, Take 1 tablet (10 mg total) by mouth 3 (three) times daily as needed for muscle spasms. (Patient not taking: Reported on 10/18/2021), Disp: 30 tablet, Rfl: 0   HYDROcodone-acetaminophen (NORCO/VICODIN) 5-325 MG tablet, Take 1-2 tablets by mouth every 4 (four) hours as needed for moderate pain ((score 4 to 6)). (Patient not taking: Reported on 10/18/2021), Disp: 50 tablet, Rfl: 0   ibuprofen (ADVIL) 800 MG tablet, Take 1 tablet (800 mg total) by mouth every 6 (six) hours as needed., Disp: 60 tablet, Rfl: 1   Multiple Vitamin (MULTIVITAMIN WITH MINERALS) TABS tablet, Take 1 tablet by mouth daily., Disp: , Rfl:    naproxen sodium (ALEVE) 220 MG tablet, Take 220 mg by mouth daily as needed (pain)., Disp: , Rfl:    ondansetron (ZOFRAN) 4 MG tablet, Take 4 mg by mouth every 8 (eight) hours as needed for nausea/vomiting. (Patient not taking: Reported on 10/18/2021), Disp: , Rfl:    ondansetron (ZOFRAN) 4 MG tablet, Take 1 tablet (4 mg total) by mouth every 8 (eight) hours as needed for nausea or vomiting., Disp: 20 tablet, Rfl: 0   oxyCODONE-acetaminophen  (PERCOCET) 5-325 MG tablet, Take 1 tablet by mouth every 4 (four) hours as needed for severe pain., Disp: 30 tablet, Rfl: 0   traMADol (ULTRAM) 50 MG tablet, Take 50 mg by mouth every 6 (six) hours as needed for moderate pain.  (Patient not taking: Reported on 10/18/2021), Disp: , Rfl:   Social History   Tobacco Use  Smoking Status Never  Smokeless Tobacco Never    Allergies  Allergen Reactions   Sulfa Antibiotics Nausea And Vomiting   Objective:  There were no vitals filed for this visit. There is no height or weight on file to calculate BMI. Constitutional Well developed. Well nourished.  Vascular Foot warm and well perfused. Capillary refill normal to all digits.   Neurologic Normal speech. Oriented to person, place, and time. Epicritic sensation to light touch grossly present bilaterally.  Dermatologic Skin healing well without signs of infection. Skin edges well coapted without signs of infection.  Orthopedic: Tenderness to palpation noted about the surgical site.   Radiographs: None Assessment:   1. Ganglion cyst   2. Bony exostosis   3. S/P foot surgery    Plan:  Patient was evaluated and treated and all questions answered.  S/p foot surgery left -Progressing as expected post-operatively. -XR: See above -WB Status: Weightbearing as tolerated in surgical shoe -Sutures: Intact.  No clinical signs of Deis is noted no complication noted. -Medications: None -Foot redressed.  No follow-ups on file.

## 2022-11-13 ENCOUNTER — Encounter: Payer: Medicare PPO | Admitting: Podiatry

## 2022-11-22 ENCOUNTER — Ambulatory Visit (INDEPENDENT_AMBULATORY_CARE_PROVIDER_SITE_OTHER): Payer: Medicare PPO | Admitting: Podiatry

## 2022-11-22 VITALS — BP 140/85

## 2022-11-22 DIAGNOSIS — M898X9 Other specified disorders of bone, unspecified site: Secondary | ICD-10-CM

## 2022-11-22 DIAGNOSIS — M6740 Ganglion, unspecified site: Secondary | ICD-10-CM

## 2022-11-22 DIAGNOSIS — Z9889 Other specified postprocedural states: Secondary | ICD-10-CM

## 2022-11-22 NOTE — Progress Notes (Signed)
  Subjective:  Patient ID: Terri Hill, female    DOB: [birth year],  MRN: [id number]  No chief complaint on file.   DOS: 10/21/2022 Procedure: Left excision of ganglion cyst and left midfoot exostosis exostectomy patient  71 y.o. female returns for post-op check.  Patient states that she is doing okay.  No pain.  She is feeling a lot better she is very get her stitches out  Review of Systems: Negative except as noted in the HPI. Denies N/V/F/Ch.  Past Medical History:  Diagnosis Date   Asthma    2019   Cataract    Pneumonia    PONV (postoperative nausea and vomiting)     Current Outpatient Medications:    cyclobenzaprine (FLEXERIL) 10 MG tablet, Take 1 tablet (10 mg total) by mouth 3 (three) times daily as needed for muscle spasms. (Patient not taking: Reported on 10/18/2021), Disp: 30 tablet, Rfl: 0   HYDROcodone-acetaminophen (NORCO/VICODIN) 5-325 MG tablet, Take 1-2 tablets by mouth every 4 (four) hours as needed for moderate pain ((score 4 to 6)). (Patient not taking: Reported on 10/18/2021), Disp: 50 tablet, Rfl: 0   ibuprofen (ADVIL) 800 MG tablet, Take 1 tablet (800 mg total) by mouth every 6 (six) hours as needed., Disp: 60 tablet, Rfl: 1   Multiple Vitamin (MULTIVITAMIN WITH MINERALS) TABS tablet, Take 1 tablet by mouth daily., Disp: , Rfl:    naproxen sodium (ALEVE) 220 MG tablet, Take 220 mg by mouth daily as needed (pain)., Disp: , Rfl:    ondansetron (ZOFRAN) 4 MG tablet, Take 4 mg by mouth every 8 (eight) hours as needed for nausea/vomiting. (Patient not taking: Reported on 10/18/2021), Disp: , Rfl:    ondansetron (ZOFRAN) 4 MG tablet, Take 1 tablet (4 mg total) by mouth every 8 (eight) hours as needed for nausea or vomiting., Disp: 20 tablet, Rfl: 0   oxyCODONE-acetaminophen (PERCOCET) 5-325 MG tablet, Take 1 tablet by mouth every 4 (four) hours as needed for severe pain., Disp: 30 tablet, Rfl: 0   traMADol (ULTRAM) 50 MG tablet, Take 50 mg by mouth every 6 (six) hours as  needed for moderate pain.  (Patient not taking: Reported on 10/18/2021), Disp: , Rfl:   Social History   Tobacco Use  Smoking Status Never  Smokeless Tobacco Never    Allergies  Allergen Reactions   Sulfa Antibiotics Nausea And Vomiting   Objective:   Vitals:   11/22/22 0925  BP: (!) 140/85   There is no height or weight on file to calculate BMI. Constitutional Well developed. Well nourished.  Vascular Foot warm and well perfused. Capillary refill normal to all digits.   Neurologic Normal speech. Oriented to person, place, and time. Epicritic sensation to light touch grossly present bilaterally.  Dermatologic Skin incision completely epithelialized.  No signs of Deis is noted no complication noted.  Orthopedic: No further tenderness to palpation noted about the surgical site.   Radiographs: None Assessment:   No diagnosis found.  Plan:  Patient was evaluated and treated and all questions answered.  S/p foot surgery left -Clinically healed patient is officially discharged from my care if any foot and ankle issues arise in future of asked her to come back and see me.  She states understanding  No follow-ups on file.

## 2022-12-05 DIAGNOSIS — M810 Age-related osteoporosis without current pathological fracture: Secondary | ICD-10-CM | POA: Diagnosis not present

## 2023-05-22 DIAGNOSIS — M25551 Pain in right hip: Secondary | ICD-10-CM | POA: Diagnosis not present

## 2023-08-09 DIAGNOSIS — R030 Elevated blood-pressure reading, without diagnosis of hypertension: Secondary | ICD-10-CM | POA: Diagnosis not present

## 2023-08-09 DIAGNOSIS — L729 Follicular cyst of the skin and subcutaneous tissue, unspecified: Secondary | ICD-10-CM | POA: Diagnosis not present

## 2023-08-09 DIAGNOSIS — D1779 Benign lipomatous neoplasm of other sites: Secondary | ICD-10-CM | POA: Diagnosis not present

## 2023-08-09 DIAGNOSIS — Z681 Body mass index (BMI) 19 or less, adult: Secondary | ICD-10-CM | POA: Diagnosis not present

## 2023-08-13 DIAGNOSIS — Z13220 Encounter for screening for lipoid disorders: Secondary | ICD-10-CM | POA: Diagnosis not present

## 2023-08-13 DIAGNOSIS — Z1329 Encounter for screening for other suspected endocrine disorder: Secondary | ICD-10-CM | POA: Diagnosis not present

## 2023-08-14 DIAGNOSIS — Z131 Encounter for screening for diabetes mellitus: Secondary | ICD-10-CM | POA: Diagnosis not present

## 2023-08-14 DIAGNOSIS — E871 Hypo-osmolality and hyponatremia: Secondary | ICD-10-CM | POA: Diagnosis not present

## 2023-08-21 DIAGNOSIS — R030 Elevated blood-pressure reading, without diagnosis of hypertension: Secondary | ICD-10-CM | POA: Diagnosis not present

## 2023-08-21 DIAGNOSIS — Z681 Body mass index (BMI) 19 or less, adult: Secondary | ICD-10-CM | POA: Diagnosis not present

## 2023-08-21 DIAGNOSIS — M810 Age-related osteoporosis without current pathological fracture: Secondary | ICD-10-CM | POA: Diagnosis not present

## 2023-08-21 DIAGNOSIS — E7849 Other hyperlipidemia: Secondary | ICD-10-CM | POA: Diagnosis not present

## 2023-09-05 DIAGNOSIS — M5416 Radiculopathy, lumbar region: Secondary | ICD-10-CM | POA: Diagnosis not present

## 2023-09-05 DIAGNOSIS — M7061 Trochanteric bursitis, right hip: Secondary | ICD-10-CM | POA: Diagnosis not present

## 2023-09-05 DIAGNOSIS — M5136 Other intervertebral disc degeneration, lumbar region: Secondary | ICD-10-CM | POA: Diagnosis not present

## 2023-09-05 DIAGNOSIS — M4126 Other idiopathic scoliosis, lumbar region: Secondary | ICD-10-CM | POA: Diagnosis not present

## 2023-09-11 DIAGNOSIS — H40013 Open angle with borderline findings, low risk, bilateral: Secondary | ICD-10-CM | POA: Diagnosis not present

## 2023-09-11 DIAGNOSIS — Z961 Presence of intraocular lens: Secondary | ICD-10-CM | POA: Diagnosis not present

## 2023-09-11 DIAGNOSIS — H04123 Dry eye syndrome of bilateral lacrimal glands: Secondary | ICD-10-CM | POA: Diagnosis not present

## 2024-03-03 DIAGNOSIS — M65332 Trigger finger, left middle finger: Secondary | ICD-10-CM | POA: Diagnosis not present

## 2024-03-03 DIAGNOSIS — M25542 Pain in joints of left hand: Secondary | ICD-10-CM | POA: Diagnosis not present

## 2024-08-14 DIAGNOSIS — R030 Elevated blood-pressure reading, without diagnosis of hypertension: Secondary | ICD-10-CM | POA: Diagnosis not present

## 2024-08-14 DIAGNOSIS — R399 Unspecified symptoms and signs involving the genitourinary system: Secondary | ICD-10-CM | POA: Diagnosis not present

## 2024-08-14 DIAGNOSIS — R350 Frequency of micturition: Secondary | ICD-10-CM | POA: Diagnosis not present

## 2024-09-30 DIAGNOSIS — H04123 Dry eye syndrome of bilateral lacrimal glands: Secondary | ICD-10-CM | POA: Diagnosis not present

## 2024-09-30 DIAGNOSIS — Z961 Presence of intraocular lens: Secondary | ICD-10-CM | POA: Diagnosis not present

## 2024-09-30 DIAGNOSIS — H40013 Open angle with borderline findings, low risk, bilateral: Secondary | ICD-10-CM | POA: Diagnosis not present

## 2024-11-02 ENCOUNTER — Other Ambulatory Visit (HOSPITAL_COMMUNITY): Payer: Self-pay | Admitting: General Practice

## 2024-11-02 DIAGNOSIS — E782 Mixed hyperlipidemia: Secondary | ICD-10-CM | POA: Diagnosis not present

## 2024-11-02 DIAGNOSIS — Z681 Body mass index (BMI) 19 or less, adult: Secondary | ICD-10-CM | POA: Diagnosis not present

## 2024-11-02 DIAGNOSIS — Z1329 Encounter for screening for other suspected endocrine disorder: Secondary | ICD-10-CM | POA: Diagnosis not present

## 2024-11-02 DIAGNOSIS — M810 Age-related osteoporosis without current pathological fracture: Secondary | ICD-10-CM | POA: Diagnosis not present

## 2024-11-02 DIAGNOSIS — Z0000 Encounter for general adult medical examination without abnormal findings: Secondary | ICD-10-CM | POA: Diagnosis not present

## 2024-11-02 DIAGNOSIS — E7849 Other hyperlipidemia: Secondary | ICD-10-CM | POA: Diagnosis not present

## 2024-11-03 DIAGNOSIS — E871 Hypo-osmolality and hyponatremia: Secondary | ICD-10-CM | POA: Diagnosis not present

## 2024-11-15 DIAGNOSIS — R7989 Other specified abnormal findings of blood chemistry: Secondary | ICD-10-CM | POA: Diagnosis not present

## 2024-12-15 ENCOUNTER — Other Ambulatory Visit (HOSPITAL_COMMUNITY)
